# Patient Record
Sex: Male | Born: 1950 | Hispanic: Yes | Marital: Single | Smoking: Former smoker
Health system: Southern US, Community
[De-identification: ages and names within clinical notes are randomized; demographics above are authoritative.]

## PROBLEM LIST (undated history)

## (undated) DIAGNOSIS — H5789 Other specified disorders of eye and adnexa: Secondary | ICD-10-CM

## (undated) DIAGNOSIS — N183 Chronic kidney disease, stage 3 (moderate): Secondary | ICD-10-CM

## (undated) DIAGNOSIS — I219 Acute myocardial infarction, unspecified: Secondary | ICD-10-CM

## (undated) DIAGNOSIS — I2510 Atherosclerotic heart disease of native coronary artery without angina pectoris: Secondary | ICD-10-CM

## (undated) DIAGNOSIS — E119 Type 2 diabetes mellitus without complications: Secondary | ICD-10-CM

## (undated) DIAGNOSIS — Z7901 Long term (current) use of anticoagulants: Secondary | ICD-10-CM

## (undated) DIAGNOSIS — E785 Hyperlipidemia, unspecified: Secondary | ICD-10-CM

## (undated) DIAGNOSIS — B49 Unspecified mycosis: Secondary | ICD-10-CM

## (undated) DIAGNOSIS — I255 Ischemic cardiomyopathy: Secondary | ICD-10-CM

## (undated) DIAGNOSIS — E1122 Type 2 diabetes mellitus with diabetic chronic kidney disease: Secondary | ICD-10-CM

## (undated) DIAGNOSIS — I447 Left bundle-branch block, unspecified: Secondary | ICD-10-CM

## (undated) HISTORY — PX: EYE SURGERY: SHX253

## (undated) HISTORY — DX: Left bundle-branch block, unspecified: I44.7

## (undated) HISTORY — DX: Long term (current) use of anticoagulants: Z79.01

## (undated) HISTORY — DX: Hyperlipidemia, unspecified: E78.5

## (undated) HISTORY — DX: Chronic kidney disease, stage 3 (moderate): N18.3

## (undated) HISTORY — DX: Atherosclerotic heart disease of native coronary artery without angina pectoris: I25.10

## (undated) HISTORY — PX: OTHER SURGICAL HISTORY: SHX169

## (undated) HISTORY — DX: Type 2 diabetes mellitus with diabetic chronic kidney disease: E11.22

## (undated) HISTORY — DX: Unspecified mycosis: B49

## (undated) HISTORY — DX: Ischemic cardiomyopathy: I25.5

## (undated) HISTORY — DX: Acute myocardial infarction, unspecified: I21.9

## (undated) HISTORY — PX: CORONARY ARTERY BYPASS GRAFT: SHX141

## (undated) HISTORY — DX: Other specified disorders of eye and adnexa: H57.89

## (undated) HISTORY — DX: Type 2 diabetes mellitus without complications: E11.9

---

## 2005-06-22 ENCOUNTER — Emergency Department (HOSPITAL_COMMUNITY): Admission: EM | Admit: 2005-06-22 | Discharge: 2005-06-22 | Payer: Self-pay | Admitting: Family Medicine

## 2006-01-11 ENCOUNTER — Emergency Department: Payer: Self-pay | Admitting: Emergency Medicine

## 2007-01-22 ENCOUNTER — Ambulatory Visit (HOSPITAL_COMMUNITY): Admission: RE | Admit: 2007-01-22 | Discharge: 2007-01-22 | Payer: Self-pay | Admitting: Internal Medicine

## 2015-12-01 ENCOUNTER — Other Ambulatory Visit: Payer: Self-pay | Admitting: *Deleted

## 2017-03-29 DIAGNOSIS — H16001 Unspecified corneal ulcer, right eye: Secondary | ICD-10-CM | POA: Diagnosis not present

## 2017-03-29 DIAGNOSIS — E113312 Type 2 diabetes mellitus with moderate nonproliferative diabetic retinopathy with macular edema, left eye: Secondary | ICD-10-CM | POA: Diagnosis not present

## 2017-03-30 DIAGNOSIS — B488 Other specified mycoses: Secondary | ICD-10-CM | POA: Diagnosis not present

## 2017-03-30 DIAGNOSIS — H1820 Unspecified corneal edema: Secondary | ICD-10-CM | POA: Diagnosis not present

## 2017-03-30 DIAGNOSIS — H16031 Corneal ulcer with hypopyon, right eye: Secondary | ICD-10-CM | POA: Diagnosis not present

## 2017-03-30 DIAGNOSIS — E113412 Type 2 diabetes mellitus with severe nonproliferative diabetic retinopathy with macular edema, left eye: Secondary | ICD-10-CM | POA: Diagnosis not present

## 2017-03-30 DIAGNOSIS — H18891 Other specified disorders of cornea, right eye: Secondary | ICD-10-CM | POA: Diagnosis not present

## 2017-03-30 DIAGNOSIS — H11411 Vascular abnormalities of conjunctiva, right eye: Secondary | ICD-10-CM | POA: Diagnosis not present

## 2017-03-31 DIAGNOSIS — B488 Other specified mycoses: Secondary | ICD-10-CM | POA: Diagnosis not present

## 2017-03-31 DIAGNOSIS — E113412 Type 2 diabetes mellitus with severe nonproliferative diabetic retinopathy with macular edema, left eye: Secondary | ICD-10-CM | POA: Diagnosis not present

## 2017-03-31 DIAGNOSIS — H16031 Corneal ulcer with hypopyon, right eye: Secondary | ICD-10-CM | POA: Diagnosis not present

## 2017-03-31 DIAGNOSIS — H18891 Other specified disorders of cornea, right eye: Secondary | ICD-10-CM | POA: Diagnosis not present

## 2017-03-31 DIAGNOSIS — H11411 Vascular abnormalities of conjunctiva, right eye: Secondary | ICD-10-CM | POA: Diagnosis not present

## 2017-03-31 DIAGNOSIS — H16001 Unspecified corneal ulcer, right eye: Secondary | ICD-10-CM | POA: Diagnosis not present

## 2017-03-31 DIAGNOSIS — H43391 Other vitreous opacities, right eye: Secondary | ICD-10-CM | POA: Diagnosis not present

## 2017-03-31 DIAGNOSIS — H1820 Unspecified corneal edema: Secondary | ICD-10-CM | POA: Diagnosis not present

## 2017-04-01 DIAGNOSIS — H538 Other visual disturbances: Secondary | ICD-10-CM | POA: Diagnosis not present

## 2017-04-01 DIAGNOSIS — H40051 Ocular hypertension, right eye: Secondary | ICD-10-CM | POA: Diagnosis not present

## 2017-04-01 DIAGNOSIS — H16031 Corneal ulcer with hypopyon, right eye: Secondary | ICD-10-CM | POA: Diagnosis not present

## 2017-04-01 DIAGNOSIS — E113412 Type 2 diabetes mellitus with severe nonproliferative diabetic retinopathy with macular edema, left eye: Secondary | ICD-10-CM | POA: Diagnosis not present

## 2017-04-01 DIAGNOSIS — H11411 Vascular abnormalities of conjunctiva, right eye: Secondary | ICD-10-CM | POA: Diagnosis not present

## 2017-04-01 DIAGNOSIS — H18891 Other specified disorders of cornea, right eye: Secondary | ICD-10-CM | POA: Diagnosis not present

## 2017-04-03 DIAGNOSIS — Z794 Long term (current) use of insulin: Secondary | ICD-10-CM | POA: Diagnosis not present

## 2017-04-03 DIAGNOSIS — H40051 Ocular hypertension, right eye: Secondary | ICD-10-CM | POA: Diagnosis not present

## 2017-04-03 DIAGNOSIS — E119 Type 2 diabetes mellitus without complications: Secondary | ICD-10-CM | POA: Diagnosis not present

## 2017-04-03 DIAGNOSIS — H16001 Unspecified corneal ulcer, right eye: Secondary | ICD-10-CM | POA: Diagnosis not present

## 2017-04-04 DIAGNOSIS — H16061 Mycotic corneal ulcer, right eye: Secondary | ICD-10-CM | POA: Diagnosis not present

## 2017-04-04 DIAGNOSIS — H43393 Other vitreous opacities, bilateral: Secondary | ICD-10-CM | POA: Diagnosis not present

## 2017-04-04 DIAGNOSIS — H16001 Unspecified corneal ulcer, right eye: Secondary | ICD-10-CM | POA: Diagnosis not present

## 2017-04-06 DIAGNOSIS — H168 Other keratitis: Secondary | ICD-10-CM | POA: Diagnosis not present

## 2017-04-06 DIAGNOSIS — Z01818 Encounter for other preprocedural examination: Secondary | ICD-10-CM | POA: Diagnosis not present

## 2017-04-06 DIAGNOSIS — E119 Type 2 diabetes mellitus without complications: Secondary | ICD-10-CM | POA: Diagnosis not present

## 2017-04-06 DIAGNOSIS — Z794 Long term (current) use of insulin: Secondary | ICD-10-CM | POA: Diagnosis not present

## 2017-04-06 DIAGNOSIS — H16071 Perforated corneal ulcer, right eye: Secondary | ICD-10-CM | POA: Diagnosis not present

## 2017-04-07 DIAGNOSIS — H16001 Unspecified corneal ulcer, right eye: Secondary | ICD-10-CM | POA: Diagnosis not present

## 2017-04-10 DIAGNOSIS — H16001 Unspecified corneal ulcer, right eye: Secondary | ICD-10-CM | POA: Diagnosis not present

## 2017-04-11 DIAGNOSIS — R079 Chest pain, unspecified: Secondary | ICD-10-CM | POA: Diagnosis not present

## 2017-04-11 DIAGNOSIS — Z0001 Encounter for general adult medical examination with abnormal findings: Secondary | ICD-10-CM | POA: Diagnosis not present

## 2017-04-11 DIAGNOSIS — H5711 Ocular pain, right eye: Secondary | ICD-10-CM | POA: Diagnosis not present

## 2017-04-11 DIAGNOSIS — Z6829 Body mass index (BMI) 29.0-29.9, adult: Secondary | ICD-10-CM | POA: Diagnosis not present

## 2017-04-11 DIAGNOSIS — Z1211 Encounter for screening for malignant neoplasm of colon: Secondary | ICD-10-CM | POA: Diagnosis not present

## 2017-04-11 DIAGNOSIS — E1165 Type 2 diabetes mellitus with hyperglycemia: Secondary | ICD-10-CM | POA: Diagnosis not present

## 2017-04-11 DIAGNOSIS — Z1389 Encounter for screening for other disorder: Secondary | ICD-10-CM | POA: Diagnosis not present

## 2017-04-11 DIAGNOSIS — E1142 Type 2 diabetes mellitus with diabetic polyneuropathy: Secondary | ICD-10-CM | POA: Diagnosis not present

## 2017-04-11 DIAGNOSIS — Z23 Encounter for immunization: Secondary | ICD-10-CM | POA: Diagnosis not present

## 2017-04-11 DIAGNOSIS — Z794 Long term (current) use of insulin: Secondary | ICD-10-CM | POA: Diagnosis not present

## 2017-04-12 DIAGNOSIS — E1142 Type 2 diabetes mellitus with diabetic polyneuropathy: Secondary | ICD-10-CM | POA: Diagnosis not present

## 2017-04-12 DIAGNOSIS — Z1389 Encounter for screening for other disorder: Secondary | ICD-10-CM | POA: Diagnosis not present

## 2017-04-12 DIAGNOSIS — Z794 Long term (current) use of insulin: Secondary | ICD-10-CM | POA: Diagnosis not present

## 2017-04-12 DIAGNOSIS — Z0001 Encounter for general adult medical examination with abnormal findings: Secondary | ICD-10-CM | POA: Diagnosis not present

## 2017-04-12 DIAGNOSIS — E1165 Type 2 diabetes mellitus with hyperglycemia: Secondary | ICD-10-CM | POA: Diagnosis not present

## 2017-04-13 DIAGNOSIS — J029 Acute pharyngitis, unspecified: Secondary | ICD-10-CM | POA: Diagnosis not present

## 2017-04-13 DIAGNOSIS — Z1389 Encounter for screening for other disorder: Secondary | ICD-10-CM | POA: Diagnosis not present

## 2017-04-13 DIAGNOSIS — I5021 Acute systolic (congestive) heart failure: Secondary | ICD-10-CM | POA: Diagnosis not present

## 2017-04-13 DIAGNOSIS — I351 Nonrheumatic aortic (valve) insufficiency: Secondary | ICD-10-CM | POA: Diagnosis not present

## 2017-04-13 DIAGNOSIS — R1310 Dysphagia, unspecified: Secondary | ICD-10-CM | POA: Diagnosis not present

## 2017-04-13 DIAGNOSIS — I110 Hypertensive heart disease with heart failure: Secondary | ICD-10-CM | POA: Diagnosis present

## 2017-04-13 DIAGNOSIS — I214 Non-ST elevation (NSTEMI) myocardial infarction: Secondary | ICD-10-CM | POA: Diagnosis not present

## 2017-04-13 DIAGNOSIS — I429 Cardiomyopathy, unspecified: Secondary | ICD-10-CM | POA: Diagnosis not present

## 2017-04-13 DIAGNOSIS — E119 Type 2 diabetes mellitus without complications: Secondary | ICD-10-CM | POA: Diagnosis not present

## 2017-04-13 DIAGNOSIS — I2510 Atherosclerotic heart disease of native coronary artery without angina pectoris: Secondary | ICD-10-CM | POA: Diagnosis not present

## 2017-04-13 DIAGNOSIS — I639 Cerebral infarction, unspecified: Secondary | ICD-10-CM | POA: Diagnosis not present

## 2017-04-13 DIAGNOSIS — R1319 Other dysphagia: Secondary | ICD-10-CM | POA: Diagnosis not present

## 2017-04-13 DIAGNOSIS — E669 Obesity, unspecified: Secondary | ICD-10-CM | POA: Diagnosis present

## 2017-04-13 DIAGNOSIS — M7989 Other specified soft tissue disorders: Secondary | ICD-10-CM | POA: Diagnosis not present

## 2017-04-13 DIAGNOSIS — I255 Ischemic cardiomyopathy: Secondary | ICD-10-CM | POA: Diagnosis present

## 2017-04-13 DIAGNOSIS — E785 Hyperlipidemia, unspecified: Secondary | ICD-10-CM | POA: Diagnosis present

## 2017-04-13 DIAGNOSIS — Z6829 Body mass index (BMI) 29.0-29.9, adult: Secondary | ICD-10-CM | POA: Diagnosis not present

## 2017-04-13 DIAGNOSIS — J020 Streptococcal pharyngitis: Secondary | ICD-10-CM | POA: Diagnosis not present

## 2017-04-13 DIAGNOSIS — B0230 Zoster ocular disease, unspecified: Secondary | ICD-10-CM | POA: Diagnosis not present

## 2017-04-13 DIAGNOSIS — I513 Intracardiac thrombosis, not elsewhere classified: Secondary | ICD-10-CM | POA: Diagnosis present

## 2017-04-13 DIAGNOSIS — R51 Headache: Secondary | ICD-10-CM | POA: Diagnosis not present

## 2017-04-13 DIAGNOSIS — H5711 Ocular pain, right eye: Secondary | ICD-10-CM | POA: Diagnosis not present

## 2017-04-13 DIAGNOSIS — I5023 Acute on chronic systolic (congestive) heart failure: Secondary | ICD-10-CM | POA: Diagnosis present

## 2017-04-13 DIAGNOSIS — Z23 Encounter for immunization: Secondary | ICD-10-CM | POA: Diagnosis not present

## 2017-04-13 DIAGNOSIS — E1165 Type 2 diabetes mellitus with hyperglycemia: Secondary | ICD-10-CM | POA: Diagnosis not present

## 2017-04-13 DIAGNOSIS — Z794 Long term (current) use of insulin: Secondary | ICD-10-CM | POA: Diagnosis not present

## 2017-04-13 DIAGNOSIS — M25461 Effusion, right knee: Secondary | ICD-10-CM | POA: Diagnosis not present

## 2017-04-13 DIAGNOSIS — Z947 Corneal transplant status: Secondary | ICD-10-CM | POA: Diagnosis not present

## 2017-04-13 DIAGNOSIS — J189 Pneumonia, unspecified organism: Secondary | ICD-10-CM | POA: Diagnosis not present

## 2017-04-13 DIAGNOSIS — R0689 Other abnormalities of breathing: Secondary | ICD-10-CM | POA: Diagnosis not present

## 2017-04-13 DIAGNOSIS — Z0001 Encounter for general adult medical examination with abnormal findings: Secondary | ICD-10-CM | POA: Diagnosis not present

## 2017-04-13 DIAGNOSIS — Z1211 Encounter for screening for malignant neoplasm of colon: Secondary | ICD-10-CM | POA: Diagnosis not present

## 2017-04-13 DIAGNOSIS — Z79899 Other long term (current) drug therapy: Secondary | ICD-10-CM | POA: Diagnosis not present

## 2017-04-13 DIAGNOSIS — R079 Chest pain, unspecified: Secondary | ICD-10-CM | POA: Diagnosis not present

## 2017-04-13 DIAGNOSIS — R042 Hemoptysis: Secondary | ICD-10-CM | POA: Diagnosis not present

## 2017-04-13 DIAGNOSIS — J90 Pleural effusion, not elsewhere classified: Secondary | ICD-10-CM | POA: Diagnosis not present

## 2017-04-13 DIAGNOSIS — E1142 Type 2 diabetes mellitus with diabetic polyneuropathy: Secondary | ICD-10-CM | POA: Diagnosis not present

## 2017-04-13 DIAGNOSIS — I63411 Cerebral infarction due to embolism of right middle cerebral artery: Secondary | ICD-10-CM | POA: Diagnosis not present

## 2017-05-02 DIAGNOSIS — I214 Non-ST elevation (NSTEMI) myocardial infarction: Secondary | ICD-10-CM | POA: Diagnosis not present

## 2017-05-02 DIAGNOSIS — I63429 Cerebral infarction due to embolism of unspecified anterior cerebral artery: Secondary | ICD-10-CM | POA: Diagnosis not present

## 2017-05-02 DIAGNOSIS — R079 Chest pain, unspecified: Secondary | ICD-10-CM | POA: Diagnosis not present

## 2017-05-12 ENCOUNTER — Ambulatory Visit (INDEPENDENT_AMBULATORY_CARE_PROVIDER_SITE_OTHER): Payer: Medicare Other | Admitting: Physician Assistant

## 2017-05-12 ENCOUNTER — Encounter: Payer: Self-pay | Admitting: Physician Assistant

## 2017-05-12 VITALS — BP 116/76 | HR 91 | Temp 99.0°F | Resp 16 | Ht 62.4 in | Wt 170.4 lb

## 2017-05-12 DIAGNOSIS — I252 Old myocardial infarction: Secondary | ICD-10-CM | POA: Insufficient documentation

## 2017-05-12 DIAGNOSIS — H578 Other specified disorders of eye and adnexa: Secondary | ICD-10-CM

## 2017-05-12 DIAGNOSIS — Z7689 Persons encountering health services in other specified circumstances: Secondary | ICD-10-CM

## 2017-05-12 DIAGNOSIS — B49 Unspecified mycosis: Secondary | ICD-10-CM | POA: Insufficient documentation

## 2017-05-12 DIAGNOSIS — I219 Acute myocardial infarction, unspecified: Secondary | ICD-10-CM | POA: Insufficient documentation

## 2017-05-12 DIAGNOSIS — Z7901 Long term (current) use of anticoagulants: Secondary | ICD-10-CM | POA: Insufficient documentation

## 2017-05-12 DIAGNOSIS — I447 Left bundle-branch block, unspecified: Secondary | ICD-10-CM | POA: Diagnosis not present

## 2017-05-12 DIAGNOSIS — H5789 Other specified disorders of eye and adnexa: Secondary | ICD-10-CM

## 2017-05-12 DIAGNOSIS — R9431 Abnormal electrocardiogram [ECG] [EKG]: Secondary | ICD-10-CM

## 2017-05-12 DIAGNOSIS — E118 Type 2 diabetes mellitus with unspecified complications: Secondary | ICD-10-CM

## 2017-05-12 DIAGNOSIS — Z947 Corneal transplant status: Secondary | ICD-10-CM | POA: Diagnosis not present

## 2017-05-12 HISTORY — DX: Other specified disorders of eye and adnexa: H57.89

## 2017-05-12 HISTORY — DX: Long term (current) use of anticoagulants: Z79.01

## 2017-05-12 HISTORY — DX: Left bundle-branch block, unspecified: I44.7

## 2017-05-12 HISTORY — DX: Unspecified mycosis: B49

## 2017-05-12 LAB — POCT CBC
Granulocyte percent: 71.8 %G (ref 37–80)
HCT, POC: 41.4 % — AB (ref 43.5–53.7)
Hemoglobin: 13.6 g/dL — AB (ref 14.1–18.1)
Lymph, poc: 2.7 (ref 0.6–3.4)
MCH, POC: 26.6 pg — AB (ref 27–31.2)
MCHC: 32.8 g/dL (ref 31.8–35.4)
MCV: 81.1 fL (ref 80–97)
MID (cbc): 0.3 (ref 0–0.9)
MPV: 8.5 fL (ref 0–99.8)
POC Granulocyte: 7.6 — AB (ref 2–6.9)
POC LYMPH PERCENT: 25.4 %L (ref 10–50)
POC MID %: 2.8 % (ref 0–12)
Platelet Count, POC: 423 10*3/uL (ref 142–424)
RBC: 5.1 M/uL (ref 4.69–6.13)
RDW, POC: 15 %
WBC: 10.6 10*3/uL — AB (ref 4.6–10.2)

## 2017-05-12 MED ORDER — "PEN NEEDLES 5/16"" 31G X 8 MM MISC": 1.0000 | Freq: Every day | 1 refills | Status: DC

## 2017-05-12 NOTE — Progress Notes (Signed)
Peter Cole  MRN: [id number] DOB: 09-07-1951  PCP: Patient, No Pcp Per  Subjective:  Pt is a pleasant 66 year old male PMH recent MI, HTN, DM, GERD who presents to clinic to establish care. He speaks Spanish and is here today with his son. Mobile interpreter used today [phone]. He moved here a week and a half ago from [place]. Does not have medical records with him today.   Recent MI - About two weeks ago he presented to the emergency department in [place] for "shooting pain to his heart". He had an MI. He is s/p coronary bypass complicated by blood clot. At discharge he was d/c's on Aspirin, Lipitor, carvedilol, clopidogrel and warfarin. Last PT/INR check was about 1.5 weeks ago.  He is currently on blood thinners. He is wearing a zoll defibrillator - he was told he will need this for 2-3 months. Ejection fraction 15%.  He is feeling well today. Denies chest pain, neck pain, arm pain, shob, palpitations, orthopnea, dyspnea.   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 Currently taking Timolol, prednisolone, dorzolamide and ciprofloxacin. Before his surgery he could not see at all. Now he is able to see a little bit - he is able to make out his moving hand.   DM - Insulin 15 units HS. He has been on insulin x 7 months. He checks his blood sugars at home - usually are around 154. He was diagnosed with DM about 7 months ago. Has had 2 toes removed from left foot several years ago in [place]. He is unsure why, however he believes this was due to an infection.   HTN - carvedilol 12.5mg  qd. Started this 2 weeks ago. Denies chest pain, headache, vision changes.    He would like a referral to cardiologist and opthalmolologist.   Review of Systems  Constitutional: Negative for chills and diaphoresis.  Respiratory: Negative for cough, chest tightness, shortness of breath and wheezing.   Cardiovascular: Negative for chest pain, palpitations and leg swelling.    Gastrointestinal: Negative for diarrhea, nausea and vomiting.  Musculoskeletal: Negative for neck pain.  Neurological: Negative for dizziness, syncope, light-headedness and headaches.  Psychiatric/Behavioral: Negative for sleep disturbance. The patient is not nervous/anxious.     There are no active problems to display for this patient.   No current outpatient prescriptions on file prior to visit.   No current facility-administered medications on file prior to visit.     No Known Allergies   Objective:  Pulse 91   Temp 99 F (37.2 C) (Oral)   Resp 16   Ht 5' 2.4" (1.585 m)   Wt 170 lb 6.4 oz (77.3 kg)   SpO2 99%   BMI 30.77 kg/m    Physical Exam  Constitutional: He is oriented to person, place, and time and well-developed, well-nourished, and in no distress. No distress.  Eyes: Right conjunctiva is injected. Left conjunctiva is not injected. Right eye exhibits normal extraocular motion. Left eye exhibits normal extraocular motion. Right pupil is not reactive. Left pupil is reactive.  Sutures appreciated circumferentially around iris.   Neck: Normal range of motion. Neck supple. Carotid bruit is not present.  Cardiovascular: Normal rate, regular rhythm, S1 normal, S2 normal, normal heart sounds and normal pulses.   No murmur heard. 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 ).  Neurological: He is alert and oriented  to person, place, and time. He has normal motor skills and normal sensation. GCS score is 15.  Skin: Skin is warm and dry.  No lesions, ulcerations, or infections of b/l feet. Distal pulses present.   Psychiatric: Mood, memory, affect and judgment normal.  Vitals reviewed.  EKG - sr, LBBB probably chronic   Assessment and Plan :  This case was discussed with Dr. Einar Gip.    1. Encounter to establish care - Pt is a 66 year old male with multiple problems here to establish care. He moved  here a week and a half ago from [place]. Does not have medical records with him today. Will have pt fill out paperwork to fax his medical records here. Telephone communications should be done through his son, Ciro Tashiro at [phone]. Advised pt to f/u in 6 weeks.  2. History of myocardial infarction 3. Nonspecific abnormal electrocardiogram (ECG) (EKG) 4. LBBB (left bundle branch block) 5. Warfarin anticoagulation - Lipid panel - POCT CBC - EKG 12-Lead - Ambulatory referral to Cardiology - Protime-INR - Discussed this pt's recent history and current EKG with Dr. Einar Gip. Dr. Einar Gip does not appreciate acute findings and suspects LBBB to be chronic. Pt is stable and asymptomatic. He is s/p MI with recent coronary bypass. Forms filled out today to have his medical records transferred here. Labs are pending. I will refer Mr. Erhart to Dr. Einar Gip.   6. Type 2 diabetes mellitus with complication, without long-term current use of insulin (HCC) - CMP14+EGFR - Hemoglobin A1c - Ambulatory referral to Endocrinology - Insulin Pen Needle (PEN NEEDLES 31GX5/16") 31G X 8 MM MISC; 1 Dose by Does not apply route daily.  Dispense: 30 each; Refill: 1 - Labs are pending. Will refer to endocrinology to establish care and management of insulin.   7. H/O cornea transplant 8. 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 Stable. Currently taking Timolol, prednisolone, dorzolamide and ciprofloxacin. Will refer for follow-up.    Mercer Pod, PA-C  [facility] at [facility] 05/12/2017 3:11 PM

## 2017-05-12 NOTE — Patient Instructions (Addendum)
Please call Peter Cole in [place], [place] and request release of information. Please have his records sent here to Primary Care at [facility]. [phone]  You will receive phone calls to schedule your appointments with cardiology (heart), opthalmology (eye) and endocrinology (diabetes).   Please come back and see me in 6 weeks.   Thank you for coming in today. I hope you feel we met your needs.  Feel free to call [facility] if you have any questions or further requests.  Please consider signing up for MyChart if you do not already have it, as this is a great way to communicate with me.  Best,  Peter Cole, Peter Cole   Recuento de carbohidratos para la diabetes mellitus en los adultos Carbohydrate Counting for Diabetes Mellitus, Adult El recuento de carbohidratos es un mtodo destinado a Catering manager un registro de la cantidad de carbohidratos que se ingieren. La ingesta natural de carbohidratos aumenta la cantidad de azcar (glucosa) en la sangre. El recuento de la cantidad de carbohidratos que se ingieren sirve para que el nivel de glucosa sangunea (glucemia) permanezca dentro de los lmites normales, lo que ayuda a Theatre manager la diabetes (diabetes mellitus) bajo control. Es importante saber la cantidad de carbohidratos que se pueden ingerir en cada comida sin correr Engineer, manufacturing. Esto es Psychologist, forensic. Un especialista en alimentacin y nutricin (nutricionista certificado) puede ayudarlo a crear un plan de alimentacin y a calcular la cantidad de carbohidratos que debe ingerir en cada comida y colacin. Los siguientes alimentos incluyen carbohidratos:  Granos, como panes y cereales.  Frijoles secos y productos con soja.  Verduras con almidn, como papas, guisantes y maz.  Lambert Mody y jugos de frutas.  Leche y Estate agent.  Dulces y colaciones, como pasteles, galletas, caramelos, papas fritas y refrescos.  Cmo se calculan los carbohidratos? Hay dos maneras de calcular los  carbohidratos de los alimentos. Puede usar cualquiera de los dos mtodos o [place] combinacin de [place]. Leer la etiqueta de informacin nutricional de los alimentos envasados La lista de informacin nutricional est incluida en las etiquetas de casi todas las bebidas y los alimentos envasados de los [place]. Incluye lo siguiente:  El tamao de la porcin.  Informacin sobre los nutrientes de cada porcin, incluidos los gramos (g) de carbohidratos por porcin.  Para usar la informacin nutricional:  Decida cuntas porciones va a comer.  Multiplique la cantidad de porciones por el nmero de carbohidratos por porcin.  El resultado es la cantidad total de carbohidratos que comer.  Conocer los tamaos de las porciones estndar de otros alimentos Cuando coma alimentos que contienen carbohidratos que no estn envasados o no incluyen la informacin nutricional en la etiqueta, debe medir las porciones para poder calcular la cantidad de carbohidratos:  Mida los alimentos que comer con una balanza de alimentos o una taza medidora, si es necesario.  Decida cuntas porciones de International aid/development worker.  Multiplique el nmero de porciones por15. La mayora de los alimentos con alto contenido de carbohidratos contienen unos 15g de carbohidratos por porcin. ? Por ejemplo, si come 8onzas (170g) de fresas, habr comido 2porciones y 30g de carbohidratos (2porciones x 15g=30g).  En el caso de las comidas que contienen mezclas de ms de un alimento, como las sopas y los guisos, debe calcular los carbohidratos de cada alimento que se incluye.  La siguiente [employer] tamaos de las porciones estndar de los alimentos corrientes con alto contenido de carbohidratos. Cada una de estas porciones tiene aproximadamente 15g  de carbohidratos:  pan de hamburguesa o muffin ingls.  onza (61m) de almbar.  onza (14g) de gelatina.  1rebanada de pan.  1tortilla de  seispulgadas.  3onzas (85g) de arroz o pasta cocidos.  4onzas (113g) de frijoles secos cocidos.  4onzas (113g) de verduras con almidn, como guisantes, maz o papas.  4onzas (113g) de cereal caliente.  4onzas (113g) de pur de papas o de una papa grande al horno.  4onzas (113g) de frutas en lata o congeladas.  4onzas (1246m de jugo de frutas.  4a 6galletas.  6croquetas de pollo.  6onzas (170g) de cereales secos sin azcar.  6onzas (170g) de yogur descremado sin ningn agregado o de yogur endulzado con edulcorante artificial.  8onzas (24064mde lecNorth Carrollton8onzas (170g) de frutas frescas o una fruta pequea.  24onzas (680g) de palomitas de maz.  Ejemplo de recuento de carbohidratos Ejemplo de comida  3onzas (85g) de pechuga de pollo.  6onzas (170g) de arroz integral.  4onzas (113g) de maz.  8onzas (240m6me leche.  8onzas (170g) de fresas con crema batida sin azcar. Clculo de carbohidratos 1. Identifique los alimentos que contienen carbohidratos: ? Arroz. ? Maz. ? Leche. ? FresHughie Cole Calcule cuntas porciones come de cada alimento: ? 2 porciones de arroz. ? 1 porcin de maz. ? 1 porcin de leche. ? 1 porcin de fresas. 3. Multiplique cada nmero de porciones por 15g: ? 2 porciones de arroz x 15 g = 30 g. ? 1 porcin de maz x 15 g = 15 g. ? 1 porcin de leche x 15 g = 15 g. ? 1 porcin de fresas x 15 g = 15 g. 4. Sume todas las cantidades para conocer el total de gramos de carbohidratos consumidos: ? 30g + 15g + 15g + 15g = 75g de carbohidratos en total. Esta informacin no tiene como fin reemplazar el consejo del mdico. Asegrese de hacerle al mdico cualquier pregunta que tenga. Document Released: 01/16/2012 Document Revised: 10/11/2016 Document Reviewed: 04/06/2016 Elsevier Interactive Patient Education  2018 [employer] Cole   IF you received an x-ray today, you will receive an invoice from  [facility]. Please contact [facility] at [phone] questions or concerns regarding your invoice.   IF you received labwork today, you will receive an invoice from [facility] at [phone] questions or concerns regarding your invoice.   Our billing staff will not be able to assist you with questions regarding bills from these companies.  You will be contacted with the lab results as soon as they are available. The fastest way to get your results is to activate your My Chart account. Instructions are located on the last page of this paperwork. If you have not heard from us rK[place]arding the results in 2 weeks, please contact this office.

## 2017-05-13 LAB — CMP14+EGFR
ALT: 20 IU/L (ref 0–44)
AST: 15 IU/L (ref 0–40)
Albumin/Globulin Ratio: 1 — ABNORMAL LOW (ref 1.2–2.2)
Albumin: 3.8 g/dL (ref 3.6–4.8)
Alkaline Phosphatase: 94 IU/L (ref 39–117)
BUN/Creatinine Ratio: 24 (ref 10–24)
BUN: 36 mg/dL — ABNORMAL HIGH (ref 8–27)
Bilirubin Total: 0.3 mg/dL (ref 0.0–1.2)
CO2: 23 mmol/L (ref 20–29)
Calcium: 9.3 mg/dL (ref 8.6–10.2)
Chloride: 94 mmol/L — ABNORMAL LOW (ref 96–106)
Creatinine, Ser: 1.49 mg/dL — ABNORMAL HIGH (ref 0.76–1.27)
GFR calc Af Amer: 56 mL/min/{1.73_m2} — ABNORMAL LOW (ref >59)
GFR calc non Af Amer: 49 mL/min/{1.73_m2} — ABNORMAL LOW (ref >59)
Globulin, Total: 3.7 g/dL (ref 1.5–4.5)
Glucose: 110 mg/dL — ABNORMAL HIGH (ref 65–99)
Potassium: 4.8 mmol/L (ref 3.5–5.2)
Sodium: 140 mmol/L (ref 134–144)
Total Protein: 7.5 g/dL (ref 6.0–8.5)

## 2017-05-13 LAB — LIPID PANEL
Chol/HDL Ratio: 2.7 ratio (ref 0.0–5.0)
Cholesterol, Total: 96 mg/dL — ABNORMAL LOW (ref 100–199)
HDL: 36 mg/dL — ABNORMAL LOW (ref >39)
LDL Calculated: 30 mg/dL (ref 0–99)
Triglycerides: 151 mg/dL — ABNORMAL HIGH (ref 0–149)
VLDL Cholesterol Cal: 30 mg/dL (ref 5–40)

## 2017-05-13 LAB — PROTIME-INR
INR: 2.8 — ABNORMAL HIGH (ref 0.8–1.2)
Prothrombin Time: 27.3 s — ABNORMAL HIGH (ref 9.1–12.0)

## 2017-05-13 LAB — HEMOGLOBIN A1C
Est. average glucose Bld gHb Est-mCnc: 212 mg/dL
Hgb A1c MFr Bld: 9 % — ABNORMAL HIGH (ref 4.8–5.6)

## 2017-05-15 ENCOUNTER — Other Ambulatory Visit: Payer: Self-pay

## 2017-05-15 ENCOUNTER — Other Ambulatory Visit: Payer: Self-pay | Admitting: Physician Assistant

## 2017-05-15 ENCOUNTER — Telehealth: Payer: Self-pay | Admitting: Physician Assistant

## 2017-05-15 MED ORDER — INSULIN DETEMIR 100 UNIT/ML ~~LOC~~ SOLN: 15.0000 [IU] | Freq: Every day | SUBCUTANEOUS | 0 refills | Status: DC

## 2017-05-15 NOTE — Progress Notes (Signed)
PT/INR levels are high. Referral is in to visit Dr. Jacinto Halim - spoke with Dr. Jacinto Halim and they will manage levels. A1C is high - referral is in for endocrinology, plan to f/u there. He will RTC in 4-6 weeks for repeat labs. Consider nephrology referral.

## 2017-05-15 NOTE — Telephone Encounter (Signed)
Pt came in office and resolved.

## 2017-05-15 NOTE — Telephone Encounter (Signed)
PATIENT'S SON [facility]) STATES HIS FATHER SAW WHITNEY MCVEY ON Friday. HE FORGOT TO TELL HER THAT HIS FATHER IS COMPLETELY OUT OF HIS INSULIN. HE DID NOT KNOW THE NAME OF IT BUT HE SAID HE SHOWED HER ALL OF HIS MEDICATIONS. BEST PHONE [phone] (SON'S NAME IS Peter Cole AND HE IS ON HIS FATHER'S 2018 HIPAA) PHARMACY CHOICE IS [facility] ON [address]. MBC

## 2017-05-18 NOTE — Telephone Encounter (Signed)
Pt calling regarding referral to Cardiologist and optometrists. Advised pt that office will be contacting him to schedule that appointment.

## 2017-05-24 ENCOUNTER — Telehealth: Payer: Self-pay | Admitting: Physician Assistant

## 2017-05-24 NOTE — Telephone Encounter (Signed)
Pt was calling to see if he could get a refill on his heart medication because he has ran out.   Please Advise   Please call his son as well about any meds or appts [phone]

## 2017-05-25 NOTE — Telephone Encounter (Signed)
LVM. Advised to call back with medication to he would like refilled.

## 2017-05-25 NOTE — Telephone Encounter (Signed)
IC pt's son.  Discussed he needs to call the phamacy and have them send the refill to [place]. Pt nor son know which medication needs refill.   Son states the medication is on his list - I further explained that we did not prescribe any of those medications and we need the pharmacy to send [place] the request.

## 2017-05-26 DIAGNOSIS — E118 Type 2 diabetes mellitus with unspecified complications: Secondary | ICD-10-CM | POA: Diagnosis not present

## 2017-05-26 DIAGNOSIS — I255 Ischemic cardiomyopathy: Secondary | ICD-10-CM | POA: Diagnosis not present

## 2017-05-26 DIAGNOSIS — Z0189 Encounter for other specified special examinations: Secondary | ICD-10-CM | POA: Diagnosis not present

## 2017-05-26 DIAGNOSIS — E1165 Type 2 diabetes mellitus with hyperglycemia: Secondary | ICD-10-CM | POA: Diagnosis not present

## 2017-05-26 DIAGNOSIS — Z7901 Long term (current) use of anticoagulants: Secondary | ICD-10-CM | POA: Diagnosis not present

## 2017-05-31 DIAGNOSIS — Z7901 Long term (current) use of anticoagulants: Secondary | ICD-10-CM | POA: Diagnosis not present

## 2017-06-07 DIAGNOSIS — H25013 Cortical age-related cataract, bilateral: Secondary | ICD-10-CM | POA: Diagnosis not present

## 2017-06-07 DIAGNOSIS — Z7901 Long term (current) use of anticoagulants: Secondary | ICD-10-CM | POA: Diagnosis not present

## 2017-06-07 DIAGNOSIS — H40013 Open angle with borderline findings, low risk, bilateral: Secondary | ICD-10-CM | POA: Diagnosis not present

## 2017-06-07 DIAGNOSIS — H2513 Age-related nuclear cataract, bilateral: Secondary | ICD-10-CM | POA: Diagnosis not present

## 2017-06-08 ENCOUNTER — Other Ambulatory Visit: Payer: Self-pay

## 2017-06-08 ENCOUNTER — Telehealth: Payer: Self-pay | Admitting: *Deleted

## 2017-06-08 ENCOUNTER — Telehealth: Payer: Self-pay | Admitting: Physician Assistant

## 2017-06-08 ENCOUNTER — Other Ambulatory Visit: Payer: Self-pay | Admitting: Physician Assistant

## 2017-06-08 DIAGNOSIS — E119 Type 2 diabetes mellitus without complications: Secondary | ICD-10-CM

## 2017-06-08 MED ORDER — GLUCOSE BLOOD VI STRP: 1.0000 | ORAL_STRIP | 2 refills | Status: DC | PRN

## 2017-06-08 MED ORDER — GLUCOSE BLOOD VI STRP: 1.0000 | ORAL_STRIP | 1 refills | Status: DC | PRN

## 2017-06-08 NOTE — Telephone Encounter (Signed)
Pt called in because he has ran out of his strips to check his bl sugar & wants to know if he could have that sent over to his pharmacy as soon as possible.   Please Advise   [phone] call his son because he speaks english & will be picking it up for his father

## 2017-06-08 NOTE — Telephone Encounter (Signed)
Spoke with patient's son regarding medication. Per Huntley Estelle, PA, advise pt's son for his dad to continue to take all of his meds until his appt with Dr. Jacinto Halim. Appt is schedule for Mon., 06/12/17, at [facility]. Glucose test strips were sent to [facility] on [address] son made aware.

## 2017-06-08 NOTE — Telephone Encounter (Signed)
Strips sent to Pharmacy, son had been informed.

## 2017-06-09 NOTE — Telephone Encounter (Signed)
Whitney, Received denial from cover my meds regarding patients test strips,  They are requesting a generic as the True Metrix Test Strips are not covered by his plan.   Please advise. Thanks, [employer]

## 2017-06-12 ENCOUNTER — Other Ambulatory Visit: Payer: Self-pay | Admitting: Physician Assistant

## 2017-06-12 DIAGNOSIS — I255 Ischemic cardiomyopathy: Secondary | ICD-10-CM | POA: Diagnosis not present

## 2017-06-12 DIAGNOSIS — Z0189 Encounter for other specified special examinations: Secondary | ICD-10-CM | POA: Diagnosis not present

## 2017-06-12 DIAGNOSIS — E118 Type 2 diabetes mellitus with unspecified complications: Secondary | ICD-10-CM | POA: Diagnosis not present

## 2017-06-12 DIAGNOSIS — E119 Type 2 diabetes mellitus without complications: Secondary | ICD-10-CM

## 2017-06-12 DIAGNOSIS — Z7901 Long term (current) use of anticoagulants: Secondary | ICD-10-CM | POA: Diagnosis not present

## 2017-06-12 DIAGNOSIS — E1165 Type 2 diabetes mellitus with hyperglycemia: Secondary | ICD-10-CM | POA: Diagnosis not present

## 2017-06-12 MED ORDER — GLUCOSE BLOOD VI STRP: ORAL_STRIP | 12 refills | Status: DC

## 2017-06-12 NOTE — Telephone Encounter (Signed)
Strips sent to pharmacy. TY.

## 2017-06-22 DIAGNOSIS — Z7901 Long term (current) use of anticoagulants: Secondary | ICD-10-CM | POA: Diagnosis not present

## 2017-06-23 ENCOUNTER — Telehealth: Payer: Self-pay | Admitting: Physician Assistant

## 2017-06-23 ENCOUNTER — Other Ambulatory Visit: Payer: Self-pay | Admitting: Emergency Medicine

## 2017-06-23 MED ORDER — SPIRONOLACTONE 25 MG PO TABS: 25.0000 mg | ORAL_TABLET | Freq: Every day | ORAL | 0 refills | Status: DC

## 2017-06-23 NOTE — Telephone Encounter (Signed)
Pt called in asking for a refill on his heart medication Spironolactone because he is on his last 2 today. Please call his son when it is ready because he speaks english His number is [phone]  Please advise

## 2017-06-23 NOTE — Telephone Encounter (Signed)
30 day supply prescribed until seen by McVey on 06/29/17

## 2017-06-27 ENCOUNTER — Other Ambulatory Visit: Payer: Self-pay

## 2017-06-27 MED ORDER — SPIRONOLACTONE 25 MG PO TABS: 25.0000 mg | ORAL_TABLET | Freq: Every day | ORAL | 0 refills | Status: DC

## 2017-06-29 ENCOUNTER — Telehealth: Payer: Self-pay | Admitting: Physician Assistant

## 2017-06-29 ENCOUNTER — Encounter: Payer: Self-pay | Admitting: Physician Assistant

## 2017-06-29 ENCOUNTER — Ambulatory Visit (INDEPENDENT_AMBULATORY_CARE_PROVIDER_SITE_OTHER): Payer: Medicare Other | Admitting: Physician Assistant

## 2017-06-29 VITALS — BP 124/73 | HR 88 | Temp 98.0°F | Resp 16 | Ht 62.0 in | Wt 171.4 lb

## 2017-06-29 DIAGNOSIS — N183 Chronic kidney disease, stage 3 unspecified: Secondary | ICD-10-CM | POA: Insufficient documentation

## 2017-06-29 DIAGNOSIS — E119 Type 2 diabetes mellitus without complications: Secondary | ICD-10-CM | POA: Diagnosis not present

## 2017-06-29 DIAGNOSIS — I252 Old myocardial infarction: Secondary | ICD-10-CM | POA: Diagnosis not present

## 2017-06-29 DIAGNOSIS — Z7901 Long term (current) use of anticoagulants: Secondary | ICD-10-CM

## 2017-06-29 DIAGNOSIS — E1122 Type 2 diabetes mellitus with diabetic chronic kidney disease: Secondary | ICD-10-CM

## 2017-06-29 HISTORY — DX: Chronic kidney disease, stage 3 unspecified: N18.30

## 2017-06-29 HISTORY — DX: Type 2 diabetes mellitus with diabetic chronic kidney disease: E11.22

## 2017-06-29 LAB — CBC WITH DIFFERENTIAL/PLATELET
Basophils Absolute: 0.1 10*3/uL (ref 0.0–0.2)
Basos: 1 %
EOS (ABSOLUTE): 0.3 10*3/uL (ref 0.0–0.4)
Eos: 3 %
Hematocrit: 40 % (ref 37.5–51.0)
Hemoglobin: 13.1 g/dL (ref 13.0–17.7)
Immature Grans (Abs): 0 10*3/uL (ref 0.0–0.1)
Immature Granulocytes: 0 %
Lymphocytes Absolute: 2.2 10*3/uL (ref 0.7–3.1)
Lymphs: 26 %
MCH: 27.3 pg (ref 26.6–33.0)
MCHC: 32.8 g/dL (ref 31.5–35.7)
MCV: 84 fL (ref 79–97)
Monocytes Absolute: 0.6 10*3/uL (ref 0.1–0.9)
Monocytes: 7 %
Neutrophils Absolute: 5.3 10*3/uL (ref 1.4–7.0)
Neutrophils: 63 %
Platelets: 307 10*3/uL (ref 150–379)
RBC: 4.79 x10E6/uL (ref 4.14–5.80)
RDW: 15.2 % (ref 12.3–15.4)
WBC: 8.5 10*3/uL (ref 3.4–10.8)

## 2017-06-29 LAB — HEMOGLOBIN A1C
Est. average glucose Bld gHb Est-mCnc: 258 mg/dL
Hgb A1c MFr Bld: 10.6 % — ABNORMAL HIGH (ref 4.8–5.6)

## 2017-06-29 LAB — CMP14+EGFR
ALT: 70 IU/L — ABNORMAL HIGH (ref 0–44)
AST: 34 IU/L (ref 0–40)
Albumin/Globulin Ratio: 1.3 (ref 1.2–2.2)
Albumin: 4 g/dL (ref 3.6–4.8)
Alkaline Phosphatase: 105 IU/L (ref 39–117)
BUN/Creatinine Ratio: 24 (ref 10–24)
BUN: 30 mg/dL — ABNORMAL HIGH (ref 8–27)
Bilirubin Total: 0.3 mg/dL (ref 0.0–1.2)
CO2: 24 mmol/L (ref 20–29)
Calcium: 9.6 mg/dL (ref 8.6–10.2)
Chloride: 96 mmol/L (ref 96–106)
Creatinine, Ser: 1.24 mg/dL (ref 0.76–1.27)
GFR calc Af Amer: 70 mL/min/{1.73_m2} (ref >59)
GFR calc non Af Amer: 61 mL/min/{1.73_m2} (ref >59)
Globulin, Total: 3.2 g/dL (ref 1.5–4.5)
Glucose: 397 mg/dL — ABNORMAL HIGH (ref 65–99)
Potassium: 5 mmol/L (ref 3.5–5.2)
Sodium: 136 mmol/L (ref 134–144)
Total Protein: 7.2 g/dL (ref 6.0–8.5)

## 2017-06-29 MED ORDER — WARFARIN SODIUM 5 MG PO TABS: 5.0000 mg | ORAL_TABLET | Freq: Every day | ORAL | 3 refills | Status: DC

## 2017-06-29 NOTE — Progress Notes (Signed)
Peter Cole  MRN: [id number] DOB: 26-Oct-1951  PCP: Patient, No Pcp Per  Subjective:  Pt is a 66 year old male PMH ischemic cardiomyopathy, LBBB, s/p CABG, stage 3 CKD, HLD who presents to clinic for f/u lab work. He speaks Spanish - mobile interpret used today.   Last OV 7/06 - referred to cardiology and endocrinology.   DM - Levemir. He checks sugars in the morning only. Will use 20 or 15 units. He has an appt in November with endocrinology. He checks blood sugars daily: 140, 150, 160's. He endorses n/t of his feet x 1 month.  S/p cornia transplant of the right eye. He was referred to the opthamologist at this last OV.  He has a f/u appt 9/21 to assess this other eye.   Cardiomyopathy - He is followed closely by Dr. Einar Gip. Next appt is next month. He is taking ASA '81mg'$  qd, lipitor, Coumadin. Last appt with Ganji pt was started on Spironolactone '25mg'$  qd, ramipril '5mg'$  qd. He is taking Carvedilol '25mg'$ .  He has INR checked by cardiology. Last INR was 3.1. Neck INR check is Aug 30.   Review of Systems  Constitutional: Negative for chills and diaphoresis.  Respiratory: Negative for cough, chest tightness, shortness of breath and wheezing.   Cardiovascular: Negative for chest pain, palpitations and leg swelling.  Gastrointestinal: Negative for diarrhea, nausea and vomiting.  Endocrine: Negative for polydipsia, polyphagia and polyuria.  Musculoskeletal: Negative for neck pain.  Neurological: Positive for numbness. Negative for dizziness, syncope, light-headedness and headaches.  Psychiatric/Behavioral: Negative for sleep disturbance. The patient is not nervous/anxious.     Patient Active Problem List   Diagnosis Date Noted  . Diabetes mellitus without complication ([place]) 40/98/1191  . Myocardial infarction ([place]) 05/12/2017  . Fungal infection of eye 05/12/2017  . History of myocardial infarction 05/12/2017  . Warfarin anticoagulation 05/12/2017  . LBBB (left bundle branch block)  05/12/2017    Current Outpatient Prescriptions on File Prior to Visit  Medication Sig Dispense Refill  . aspirin EC 81 MG tablet Take 81 mg by mouth daily.    Marland Kitchen atorvastatin (LIPITOR) 80 MG tablet Take 80 mg by mouth daily.    . carvedilol (COREG) 12.5 MG tablet Take 12.5 mg by mouth 2 (two) times daily with a meal.    . ciprofloxacin (CILOXAN) 0.3 % ophthalmic solution Place 1 drop into the right eye 2 (two) times daily. Administer 1 drop, every 2 hours, while awake, for 2 days. Then 1 drop, every 4 hours, while awake, for the next 5 days.    . clopidogrel (PLAVIX) 75 MG tablet Take 75 mg by mouth daily.    . dorzolamide (TRUSOPT) 2 % ophthalmic solution Place 1 drop into the left eye 2 (two) times daily.    . furosemide (LASIX) 40 MG tablet Take 40 mg by mouth.    Marland Kitchen glucose blood (TRUE METRIX BLOOD GLUCOSE TEST) test strip 1 each by Other route as needed for other. Use as instructed 100 each 2  . glucose blood test strip Use as instructed 100 each 12  . insulin detemir (LEVEMIR) 100 UNIT/ML injection Inject 0.15 mLs (15 Units total) into the skin daily. 10 mL 0  . Insulin Pen Needle (PEN NEEDLES 31GX5/16") 31G X 8 MM MISC 1 Dose by Does not apply route daily. 30 each 1  . Lancet Device MISC by Does not apply route.    . pantoprazole (PROTONIX) 40 MG tablet Take 40 mg by mouth daily.    Marland Kitchen  polyvinyl alcohol (LIQUIFILM TEARS) 1.4 % ophthalmic solution Place 1 drop into both eyes as needed for dry eyes.    . potassium chloride SA (K-DUR,KLOR-CON) 20 MEQ tablet Take 20 mEq by mouth daily.    . prednisoLONE acetate (PRED FORTE) 1 % ophthalmic suspension Place 1 drop into the right eye 4 (four) times daily.    . ramipril (ALTACE) 5 MG capsule Take 5 mg by mouth daily.    Marland Kitchen spironolactone (ALDACTONE) 25 MG tablet Take 1 tablet (25 mg total) by mouth daily. 30 tablet 0  . timolol (BETIMOL) 0.5 % ophthalmic solution Place 1 drop into both eyes 2 (two) times daily.    . TRUEPLUS LANCETS 74B MISC 1  application by Does not apply route as directed.    . warfarin (COUMADIN) 7.5 MG tablet Take 7.5 mg by mouth daily at 6 PM.     No current facility-administered medications on file prior to visit.     No Known Allergies   Objective:  There were no vitals taken for this visit.  Physical Exam  Constitutional: He is oriented to person, place, and time and well-developed, well-nourished, and in no distress. No distress.  Eyes: Conjunctivae are normal. Right pupil is not reactive.  Suture marks visible on iris  Cardiovascular: Normal rate, regular rhythm and normal heart sounds.   Neurological: He is alert and oriented to person, place, and time. GCS score is 15.  Skin: Skin is warm and dry.  Psychiatric: Mood, memory, affect and judgment normal.  Vitals reviewed.   Assessment and Plan :  1. Diabetes mellitus without complication ([place]) - BUY37+QDUK - CBC with Differential/Platelet - Hemoglobin A1c - Microalbumin, urine - Pt has appt in November with endocrinology. Labs are pending, will contact with results. RTC in 3 months.  2. History of myocardial infarction - Pt is followed by Dr. Einar Gip. Next appt is next month. Con't current medications. He is taking ASA '81mg'$  qd, lipitor, Coumadin. Last appt with Ganji pt was started on Spironolactone '25mg'$  qd, ramipril '5mg'$  qd. He is taking Carvedilol '25mg'$ .   3. Warfarin anticoagulation - warfarin (COUMADIN) 5 MG tablet; Take 1 tablet (5 mg total) by mouth daily at 6 PM.  Dispense: 30 tablet; Refill: 3 - He has INR checked by cardiology. Last INR was 3.1. Neck INR check is Aug 30.   4. Stage 3 chronic kidney disease due to type 2 diabetes mellitus ([place]) - Ambulatory referral to Nephrology   Mercer Pod, PA-C  [facility] 06/29/2017 10:11 AM

## 2017-06-29 NOTE — Patient Instructions (Addendum)
  Recibir una llamada telefnica para programar una cita con el especialista en riones (You will receive a phone call to schedule an appt with the kidney specialist.) Por favor, conserve todas sus citas con el cardilogo, el oftalmlogo y Photographer (Please keep all of your appointments with the cardiologist, eye doctor and endocrinologist.) Vuelve a verme en 3 meses. (Come back and see me in 3 months.)  Thank you for coming in today. I hope you feel we met your needs.  Feel free to call PCP if you have any questions or further requests.  Please consider signing up for MyChart if you do not already have it, as this is a great way to communicate with me.  Best,  Whitney McVey, PA-C   IF you received an x-ray today, you will receive an invoice from [facility]. Please contact [facility] at [phone] with questions or concerns regarding your invoice.   IF you received labwork today, you will receive an invoice from [place]. Please contact [facility] at [phone] with questions or concerns regarding your invoice.   Our billing staff will not be able to assist you with questions regarding bills from these companies.  You will be contacted with the lab results as soon as they are available. The fastest way to get your results is to activate your My Chart account. Instructions are located on the last page of this paperwork. If you have not heard from [place] regarding the results in 2 weeks, please contact this office.

## 2017-06-29 NOTE — Telephone Encounter (Signed)
Patient called, required translator, the patient expressed that he was supposed to pick up two medications but only one was ordered (warfarin was the medicine picked up). Patient said he also needed a medicine called cardedilol, but this was not at the pharmacy. Patient wishes the perscription to be sent to the same [facility] he picked up the Warfarin from, located in [place].  Please advise  Called on [phone]

## 2017-06-30 LAB — MICROALBUMIN, URINE: Microalbumin, Urine: 614.2 ug/mL

## 2017-07-03 ENCOUNTER — Telehealth: Payer: Self-pay | Admitting: Physician Assistant

## 2017-07-03 NOTE — Telephone Encounter (Signed)
Pt was calling in to verify that the message from the 23rd was put In because he has not heard anything back yet from [place], he wants [place] to call his son as soon as the medication Cardedilol is ready for pick up at the [facility] pharmacy..[phone]  Please Advise

## 2017-07-06 DIAGNOSIS — I255 Ischemic cardiomyopathy: Secondary | ICD-10-CM | POA: Insufficient documentation

## 2017-07-06 DIAGNOSIS — I10 Essential (primary) hypertension: Secondary | ICD-10-CM | POA: Diagnosis not present

## 2017-07-06 HISTORY — DX: Ischemic cardiomyopathy: I25.5

## 2017-07-07 ENCOUNTER — Other Ambulatory Visit: Payer: Self-pay | Admitting: Physician Assistant

## 2017-07-07 DIAGNOSIS — I10 Essential (primary) hypertension: Secondary | ICD-10-CM

## 2017-07-07 DIAGNOSIS — Z7901 Long term (current) use of anticoagulants: Secondary | ICD-10-CM | POA: Diagnosis not present

## 2017-07-07 MED ORDER — CARVEDILOL 12.5 MG PO TABS
12.5000 mg | ORAL_TABLET | Freq: Two times a day (BID) | ORAL | 4 refills | Status: DC
Start: 2017-07-07 — End: 2017-08-15

## 2017-07-07 NOTE — Progress Notes (Signed)
Pt has appt with endocrinology in Nov, however will try to get him seen sooner.

## 2017-07-07 NOTE — Telephone Encounter (Signed)
My apologies. I was not under the impression he needed refills. Rx sent to pharmacy.  Thank you!

## 2017-07-07 NOTE — Addendum Note (Signed)
Addended by: Sebastian Ache on: 07/07/2017 05:02 PM   Modules accepted: Orders

## 2017-07-14 DIAGNOSIS — E1122 Type 2 diabetes mellitus with diabetic chronic kidney disease: Secondary | ICD-10-CM | POA: Diagnosis not present

## 2017-07-14 DIAGNOSIS — I10 Essential (primary) hypertension: Secondary | ICD-10-CM | POA: Diagnosis not present

## 2017-07-14 DIAGNOSIS — Z951 Presence of aortocoronary bypass graft: Secondary | ICD-10-CM | POA: Diagnosis not present

## 2017-07-14 DIAGNOSIS — I255 Ischemic cardiomyopathy: Secondary | ICD-10-CM | POA: Diagnosis not present

## 2017-07-19 ENCOUNTER — Telehealth: Payer: Self-pay | Admitting: Physician Assistant

## 2017-07-19 NOTE — Telephone Encounter (Signed)
Thank you :)

## 2017-07-19 NOTE — Telephone Encounter (Signed)
Westly Pam from [facility] called stating pt referral was not accepted by their doctor. I sent here to try to get an earlier appt. I have now sent the referral to [facility]. This office is scheduling the soonest out of the Endo doctors we normally send to. Thanks!

## 2017-07-20 ENCOUNTER — Telehealth: Payer: Self-pay | Admitting: Endocrinology

## 2017-07-20 ENCOUNTER — Telehealth: Payer: Self-pay | Admitting: Physician Assistant

## 2017-07-20 NOTE — Telephone Encounter (Signed)
Calling to check on the status of new patient referral that is urgent. Please call.

## 2017-07-20 NOTE — Telephone Encounter (Signed)
Diane from [facility] called to verify pt information for referral appt he has scheduled in November. I sent the new urgent referral to [facility] as well to see if we can get a sooner appt. I told Diane the referral had been changed to urgent as well and she said she would send it for review in their office to see if they can fit him in sooner. Both offices are working on soonest appts for the pt and are going to give me a call when they find out. We will schedule pt for the soonest between the two. Thanks!

## 2017-07-21 ENCOUNTER — Encounter: Payer: Self-pay | Admitting: *Deleted

## 2017-07-24 NOTE — Telephone Encounter (Signed)
Pt is scheduled at [facility] on 9/21 at 3:45pm. I spoke with the pt's son and he said it was okay for me to cancel the other appt with Dr. Sharl Ma. Thanks!

## 2017-07-24 NOTE — Telephone Encounter (Signed)
Perfect!  Thank you so much.

## 2017-07-24 NOTE — Telephone Encounter (Signed)
Thank you so much

## 2017-07-28 ENCOUNTER — Encounter: Payer: Self-pay | Admitting: Endocrinology

## 2017-07-28 ENCOUNTER — Ambulatory Visit (INDEPENDENT_AMBULATORY_CARE_PROVIDER_SITE_OTHER): Payer: Medicare Other | Admitting: Endocrinology

## 2017-07-28 DIAGNOSIS — H353122 Nonexudative age-related macular degeneration, left eye, intermediate dry stage: Secondary | ICD-10-CM | POA: Diagnosis not present

## 2017-07-28 DIAGNOSIS — N183 Chronic kidney disease, stage 3 (moderate): Secondary | ICD-10-CM | POA: Diagnosis not present

## 2017-07-28 DIAGNOSIS — Z794 Long term (current) use of insulin: Secondary | ICD-10-CM | POA: Diagnosis not present

## 2017-07-28 DIAGNOSIS — E113293 Type 2 diabetes mellitus with mild nonproliferative diabetic retinopathy without macular edema, bilateral: Secondary | ICD-10-CM | POA: Diagnosis not present

## 2017-07-28 DIAGNOSIS — H25813 Combined forms of age-related cataract, bilateral: Secondary | ICD-10-CM | POA: Diagnosis not present

## 2017-07-28 DIAGNOSIS — H40003 Preglaucoma, unspecified, bilateral: Secondary | ICD-10-CM | POA: Diagnosis not present

## 2017-07-28 DIAGNOSIS — Z947 Corneal transplant status: Secondary | ICD-10-CM | POA: Diagnosis not present

## 2017-07-28 DIAGNOSIS — H35032 Hypertensive retinopathy, left eye: Secondary | ICD-10-CM | POA: Diagnosis not present

## 2017-07-28 DIAGNOSIS — E1122 Type 2 diabetes mellitus with diabetic chronic kidney disease: Secondary | ICD-10-CM | POA: Diagnosis not present

## 2017-07-28 DIAGNOSIS — E113292 Type 2 diabetes mellitus with mild nonproliferative diabetic retinopathy without macular edema, left eye: Secondary | ICD-10-CM | POA: Diagnosis not present

## 2017-07-28 MED ORDER — INSULIN DETEMIR 100 UNIT/ML FLEXPEN: 30.0000 [IU] | PEN_INJECTOR | SUBCUTANEOUS | 11 refills | Status: DC

## 2017-07-28 NOTE — Patient Instructions (Signed)
good diet and exercise significantly improve the control of your diabetes.  please let me know if you wish to be referred to a dietician.  high blood sugar is very risky to your health.  you should see an eye doctor and dentist every year.  It is very important to get all recommended vaccinations.  Controlling your blood pressure and cholesterol drastically reduces the damage diabetes does to your body.  Those who smoke should quit.  Please discuss these with your doctor.  check your blood sugar twice a day.  vary the time of day when you check, between before the 3 meals, and at bedtime.  also check if you have symptoms of your blood sugar being too high or too low.  please keep a record of the readings and bring it to your next appointment here (or you can bring the meter itself).  You can write it on any piece of paper.  please call us sooner if your blood sugar goes below 70, or if you have a lot of readings over 200. Please increase the insulin to 30 units each morning. Please come back for a follow-up appointment in 6 weeks Please call or message [place] next week, to tell us how the blood sugar is doing.

## 2017-07-28 NOTE — Progress Notes (Signed)
Subjective:    Patient ID: Peter Cole, male    DOB: [birth year], 66 y.o.   MRN: [id number]  HPI pt is referred by Hulen Shouts, PA, for diabetes.  Pt states DM was dx'ed in 2015; he has moderate neuropathy of the lower extremities; he has associated renal failure and toe amputation; he has been on insulin since 2017; pt says his diet and exercise are fair; he has never had pancreatitis, pancreatic surgery, severe hypoglycemia or DKA.  He takes levemir, 15-20 units qam, and says he never misses it.  He says cbg's are in the low-100's fasting, which is the only time of day he checks.  Past Medical History:  Diagnosis Date  . Diabetes mellitus without complication (HCC)   . Myocardial infarction [facility])     Past Surgical History:  Procedure Laterality Date  . EYE SURGERY      Social History   Social History  . Marital status: Single    Spouse name: N/A  . Number of children: N/A  . Years of education: N/A   Occupational History  . Not on file.   Social History Main Topics  . Smoking status: Former Games developer  . Smokeless tobacco: Never Used     Comment: 40 years ago  . Alcohol use No  . Drug use: No  . Sexual activity: Not on file   Other Topics Concern  . Not on file   Social History Narrative  . No narrative on file    Current Outpatient Prescriptions on File Prior to Visit  Medication Sig Dispense Refill  . aspirin EC 81 MG tablet Take 81 mg by mouth daily.    . clopidogrel (PLAVIX) 75 MG tablet Take 75 mg by mouth daily.    . dorzolamide (TRUSOPT) 2 % ophthalmic solution Place 1 drop into the left eye 2 (two) times daily.    Marland Kitchen glucose blood (TRUE METRIX BLOOD GLUCOSE TEST) test strip 1 each by Other route as needed for other. Use as instructed 100 each 2  . glucose blood test strip Use as instructed 100 each 12  . insulin detemir (LEVEMIR) 100 UNIT/ML injection Inject 0.15 mLs (15 Units total) into the skin daily. 10 mL 0  . Lancet Device MISC by Does not apply  route.    . polyvinyl alcohol (LIQUIFILM TEARS) 1.4 % ophthalmic solution Place 1 drop into both eyes as needed for dry eyes.    . ramipril (ALTACE) 5 MG capsule Take 5 mg by mouth daily.    Marland Kitchen spironolactone (ALDACTONE) 25 MG tablet Take 1 tablet (25 mg total) by mouth daily. 30 tablet 0  . timolol (BETIMOL) 0.5 % ophthalmic solution Place 1 drop into both eyes 2 (two) times daily.    . TRUEPLUS LANCETS 33G MISC 1 application by Does not apply route as directed.    Marland Kitchen atorvastatin (LIPITOR) 80 MG tablet Take 80 mg by mouth daily.    . carvedilol (COREG) 12.5 MG tablet Take 1 tablet (12.5 mg total) by mouth 2 (two) times daily with a meal. (Patient not taking: Reported on 07/28/2017) 60 tablet 4  . furosemide (LASIX) 40 MG tablet Take 40 mg by mouth.    . pantoprazole (PROTONIX) 40 MG tablet Take 40 mg by mouth daily.    . potassium chloride SA (K-DUR,KLOR-CON) 20 MEQ tablet Take 20 mEq by mouth daily.     No current facility-administered medications on file prior to visit.     No Known Allergies  Family  History  Problem Relation Age of Onset  . Heart failure Mother   . Diabetes Mother   . Heart attack Father     BP 112/74   Pulse 70   Wt 173 lb 9.6 oz (78.7 kg)   SpO2 97%   BMI 31.75 kg/m   Review of Systems denies weight loss, headache, chest pain, sob, n/v, urinary frequency, muscle cramps, excessive diaphoresis, depression, cold intolerance, rhinorrhea, and easy bruising.  He has blurry vision.      Objective:   Physical Exam VS: see vs page GEN: no distress HEAD: head: no deformity eyes: no periorbital swelling, no proptosis external nose and ears are normal mouth: no lesion seen NECK: supple, thyroid is not enlarged CHEST WALL: no deformity LUNGS: clear to auscultation CV: reg rate and rhythm, no murmur ABD: abdomen is soft, nontender.  no hepatosplenomegaly.  not distended.  no hernia MUSCULOSKELETAL: muscle bulk and strength are grossly normal.  no obvious joint  swelling.  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  no ulcer on the feet.  feet are of normal color and temp.  Trace bilat leg edema PULSES: dorsalis pedis intact bilat.  no carotid bruit NEURO:  cn 2-12 grossly intact.   readily moves all 4's.  sensation is intact to touch on the feet, but decreased from normal SKIN:  Normal texture and temperature.  No rash or suspicious lesion is visible.   NODES:  None palpable at the neck PSYCH: alert, well-oriented.  Does not appear anxious nor depressed.   Lab Results  Component Value Date   HGBA1C 10.6 (H) 06/29/2017   Lab Results  Component Value Date   CREATININE 1.24 06/29/2017   BUN 30 (H) 06/29/2017   NA 136 06/29/2017   K 5.0 06/29/2017   CL 96 06/29/2017   CO2 24 06/29/2017   I personally reviewed electrocardiogram tracing (05/12/17): Indication: DM Impression: NSR.  No MI.  LBBB No comparison is available.   I have reviewed outside records, and summarized: Pt was noted to have severely elevated a1c, and referred here.  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   Patient Instructions  good diet and exercise significantly improve the control of your diabetes.  please let me know if you wish to be referred to a dietician.  high blood sugar is very risky to your health.  you should see an eye doctor and dentist every year.  It is very important to get all recommended vaccinations.  Controlling your blood pressure and cholesterol drastically reduces the damage diabetes does to your body.  Those who smoke should quit.  Please discuss these with your doctor.  check your blood sugar twice a day.  vary the time of day when you check, between before the 3 meals, and at bedtime.  also check if you have symptoms of your  blood sugar being too high or too low.  please keep a record of the readings and bring it to your next appointment here (or you can bring the meter itself).  You can write it on any piece of paper.  please call us sooner if your blood sugar goes below 70, or if you have a lot of readings over 200. Please increase the insulin to 30 units each morning.  Please come back for a follow-up appointment in 6 weeks Please call or message [place] next week, to tell us how the blood sugar is doing.

## 2017-07-30 DIAGNOSIS — Z794 Long term (current) use of insulin: Secondary | ICD-10-CM

## 2017-07-30 DIAGNOSIS — E1140 Type 2 diabetes mellitus with diabetic neuropathy, unspecified: Secondary | ICD-10-CM | POA: Insufficient documentation

## 2017-08-02 ENCOUNTER — Institutional Professional Consult (permissible substitution): Payer: Self-pay | Admitting: Cardiology

## 2017-08-03 ENCOUNTER — Telehealth: Payer: Self-pay | Admitting: Physician Assistant

## 2017-08-03 NOTE — Telephone Encounter (Signed)
Pt is calling to request a refill of his Ramipril, Sprionolactone, and lanclets.  Pt still uses the [facility] on [address].  Please advise

## 2017-08-04 ENCOUNTER — Other Ambulatory Visit: Payer: Self-pay

## 2017-08-04 ENCOUNTER — Encounter: Payer: Self-pay | Admitting: Cardiology

## 2017-08-04 DIAGNOSIS — I219 Acute myocardial infarction, unspecified: Secondary | ICD-10-CM

## 2017-08-04 MED ORDER — LANCET DEVICE MISC: 33.0000 g | Freq: Every day | 0 refills | Status: DC

## 2017-08-07 ENCOUNTER — Encounter: Payer: Self-pay | Admitting: Urgent Care

## 2017-08-07 ENCOUNTER — Ambulatory Visit (INDEPENDENT_AMBULATORY_CARE_PROVIDER_SITE_OTHER): Payer: Medicare Other | Admitting: Urgent Care

## 2017-08-07 VITALS — BP 113/71 | HR 77 | Temp 98.5°F | Resp 18 | Ht 62.0 in | Wt 173.0 lb

## 2017-08-07 DIAGNOSIS — E119 Type 2 diabetes mellitus without complications: Secondary | ICD-10-CM

## 2017-08-07 DIAGNOSIS — Z114 Encounter for screening for human immunodeficiency virus [HIV]: Secondary | ICD-10-CM

## 2017-08-07 DIAGNOSIS — I10 Essential (primary) hypertension: Secondary | ICD-10-CM

## 2017-08-07 DIAGNOSIS — I255 Ischemic cardiomyopathy: Secondary | ICD-10-CM

## 2017-08-07 DIAGNOSIS — Z23 Encounter for immunization: Secondary | ICD-10-CM

## 2017-08-07 MED ORDER — SPIRONOLACTONE 25 MG PO TABS: 25.0000 mg | ORAL_TABLET | Freq: Every day | ORAL | 1 refills | Status: DC

## 2017-08-07 MED ORDER — TRUEPLUS LANCETS 33G MISC: 1.0000 "application " | 1 refills | Status: DC

## 2017-08-07 MED ORDER — RAMIPRIL 5 MG PO CAPS: 5.0000 mg | ORAL_CAPSULE | Freq: Every day | ORAL | 1 refills | Status: DC

## 2017-08-07 NOTE — Progress Notes (Signed)
    MRN: [id number] DOB: [birth year]  Subjective:   Peter Cole is a 66 y.o. male presenting for follow up on Hypertension.   Currently managed with spironolactone, ramipril. Avoids salt in diet, is stayig very active. Denies dizziness, chronic headache, blurred vision, chest pain, shortness of breath, heart racing, palpitations, nausea, vomiting, abdominal pain, hematuria, lower leg swelling. Denies smoking cigarettes or drinking alcohol. He is also requesting refill for his lancets. Needs to have immunizations updated including influenza, Prevnar 13 and tdap.   Peter Cole has a current medication list which includes the following prescription(s): aspirin ec, atorvastatin, carvedilol, clopidogrel, dorzolamide, furosemide, glucose blood, glucose blood, insulin detemir, insulin detemir, lancet device, pantoprazole, polyvinyl alcohol, potassium chloride sa, ramipril, spironolactone, timolol, and trueplus lancets 33g. Also has No Known Allergies.  Peter Cole  has a past medical history of Diabetes mellitus without complication (HCC) and Myocardial infarction (HCC). Also  has a past surgical history that includes Eye surgery.  Objective:   Vitals: BP 113/71   Pulse 77   Temp 98.5 F (36.9 C) (Oral)   Resp 18   Ht 5\' 2"  (1.575 m)   Wt 173 lb (78.5 kg)   SpO2 99%   BMI 31.64 kg/m   BP Readings from Last 3 Encounters:  08/07/17 113/71  07/28/17 112/74  06/29/17 124/73   Physical Exam  Constitutional: He is oriented to person, place, and time. He appears well-developed and well-nourished.  HENT:  Mouth/Throat: Oropharynx is clear and moist.  Eyes: No scleral icterus.  Cardiovascular: Normal rate, regular rhythm and intact distal pulses.  Exam reveals no gallop and no friction rub.   No murmur heard. Pulmonary/Chest: No respiratory distress. He has no wheezes. He has no rales.  Musculoskeletal: He exhibits no edema.  Neurological: He is alert and oriented to person, place, and time.  Skin:  Skin is warm and dry.  Psychiatric: He has a normal mood and affect.    Assessment and Plan :   1. Diabetes mellitus without complication (HCC) - Continue f/u with endocrinology. Refilled lancets.   2. Essential hypertension - Well controlled. Refilled medications for next 6 months. Due for f/u then. Counseled patient on potential for adverse effects with medications prescribed today, patient verbalized understanding.   3. Screening for HIV without presence of risk factors - HIV antibody  4. Need for prophylactic vaccination and inoculation against influenza - Flu Vaccine QUAD 36+ mos IM  5. Need for Tdap vaccination - Tdap vaccine greater than or equal to 7yo IM  6. Need for prophylactic vaccination against Streptococcus pneumoniae (pneumococcus) - Pneumococcal conjugate vaccine 13-valent IM   At the end of his visit, patient stated that he has some difficulty swallowing at times. I advised he set up a follow up visit given that we covered HTN follow up, lab review from his last office visit and updated his immunizations. In the meantime, patient will try to address allergies and GERD as a source of his symptoms. He will set up an office visit with me.   Wallis Bamberg, PA-C [facility] at [facility] [phone] 08/07/2017  10:05 AM

## 2017-08-07 NOTE — Patient Instructions (Addendum)
Para alergia, tome Zyrtec  una ves diaramente. Para acides puede tomar Zantac  dos veces diaramente, una hora antes de comer.   Diabetes mellitus y [place] fsica (Diabetes Mellitus and Exercise) Hacer actividad fsica habitualmente es importante para el estado de salud general, en especial si tiene diabetes (diabetes mellitus). La actividad fsica no solo se reduce a Psychiatric nurse. Aporta muchos beneficios para la salud, como aumentar la fuerza muscular y la densidad sea, y reducir las grasas corporales y Development worker, community. Esto mejora el estado fsico, la flexibilidad y la resistencia, y todo ello redunda en un mejor estado de salud general. La actividad fsica tiene beneficios adicionales para los diabticos, entre ellos:  Disminuye el apetito.  Ayuda a bajar y Photographer glucemia bajo control.  Baja la presin arterial.  Ayuda a controlar las cantidades de sustancias grasas (lpidos) en la [place], como el colesterol y los triglicridos.  Mejora la respuesta del cuerpo a la insulina (optimizacin de la sensibilidad a la insulina).  Reduce la cantidad de insulina que el cuerpo necesita.  Reduce el riesgo de sufrir cardiopata coronaria de la siguiente forma: ? [employer] de colesterol y triglicridos. ? Aumenta los niveles de colesterol bueno. ? Disminuye la glucemia. QU ES EL PLAN DE ACTIVIDADES? El mdico o un educador para la diabetes certificado pueden ayudarlo a Teacher, English as a foreign language del tipo y de la frecuencia de actividad fsica (plan de actividades) adecuado para usted. Asegrese de lo siguiente:  Haga por lo menos semanales de ejercicios de intensidad moderada o vigorosa. Estos podran ser caminatas dinmicas, ciclismo o [place]. ? Haga ejercicios de elongacin y de fortalecimiento, como yoga o levantamiento de pesas, por lo menos 2veces por semana. ? Reparta la actividad en al menos 3das de la semana.  Haga algn tipo de actividad  fsica [employer]. ? No deje pasar ms de 2das seguidos sin hacer algn tipo de actividad fsica. ? No permanezca inactivo durante ms de seguidos. Tmese descansos frecuentes para caminar o estirarse.  Elija un tipo de ejercicio o de actividad que disfrute y establezca objetivos realistas.  Comience lentamente y aumente de [place] gradual la intensidad del ejercicio con el correr del [place]. QU DEBO SABER ACERCA DEL CONTROL DE LA DIABETES?  Contrlese la glucemia antes y despus de ejercitarse. ? Si la glucemia supera los /dl (16,1WRUE/A) antes de ejercitarse, hgase un control de la orina para detectar la presencia de cetonas. Si tiene [employer] orina, no se ejercite hasta que la glucemia se normalice.  Conozca los sntomas de la glucemia baja (hipoglucemia) y aprenda cmo tratarla. El riesgo de tener hipoglucemia [place] durante y despus de hacer actividad fsica. Los sntomas frecuentes de hipoglucemia pueden incluir los siguientes: ? Hambre. ? Ansiedad. ? Sudoracin y [employer] ? Confusin. ? Mareos o sensacin de desvanecimiento. ? Aumento de la frecuencia cardaca o palpitaciones. ? Visin borrosa. ? Hormigueo o adormecimiento alrededor [employer], los labios o la [place]. ? Temblores o sacudidas. ? Irritabilidad.  Tenga una colacin de carbohidratos de accin rpida disponible antes, durante y despus de ejercitarse, a fin de evitar o tratar la hipoglucemia.  Evite inyectarse insulina en las zonas del cuerpo que ejercitar. Por ejemplo, evite inyectarse insulina en: ? Los brazos, si juega al tenis. ? Las piernas, si corre.  Lleve registros de sus hbitos de actividad fsica. Esto puede ayudarlos a usted y al mdico a Retail banker de control de la diabetes segn  sea necesario. Escriba los siguientes datos: ? Los alimentos que consume antes y despus de Radio producer actividad fsica. ? Los niveles de glucosa en la sangre antes y despus de hacer  ejercicios. ? El tipo y cantidad de [place] fsica que Biomedical engineer. ? Cuando se prev que la insulina alcance su valor mximo, si [place] insulina. No haga actividad fsica en los momentos en que insulina alcanza su valor mximo.  Cuando comience un ejercicio o una actividad nuevos, trabaje con el mdico para asegurarse de que la actividad sea segura para usted y para Academic librarian la [place], los medicamentos o la ingesta de alimentos segn sea necesario.  Beba gran cantidad de agua mientras hace ejercicios para evitar la deshidratacin o los golpes de Airline pilot. Beba suficiente lquido para Photographer orina clara o de color amarillo plido. Esta informacin no tiene Theme park manager el consejo del mdico. Asegrese de hacerle al mdico cualquier pregunta que tenga. Document Released: 11/13/2007 Document Revised: 02/15/2016 Document Reviewed: 04/04/2016 Elsevier Interactive Patient Education  2018 [employer].   Hipertensin Hypertension La hipertensin, conocida comnmente como presin arterial alta, se produce cuando la sangre bombea en las arterias con mucha fuerza. Las arterias son los vasos sanguneos que transportan la sangre desde el corazn al resto del cuerpo. La hipertensin hace que el corazn haga ms esfuerzo para Insurance account manager y [employer] Holdings Corporation que las arterias se Armed forces training and education officer o Multimedia programmer. La hipertensin no tratada o no controlada puede causar infarto de miocardio, accidentes cerebrovasculares, enfermedad renal y otros problemas. Una lectura de la presin arterial consiste de un nmero ms alto sobre un nmero ms bajo. En condiciones ideales, la presin arterial debe estar por debajo de 120/80. El primer nmero ("superior") es la presin sistlica. Es la medida de la presin de las arterias cuando el corazn late. El segundo nmero ("inferior") es la presin diastlica. Es la medida de la presin en las arterias cuando el corazn se relaja. Cules son las causas? Se desconoce la causa de esta  afeccin. Qu incrementa el riesgo? Algunos factores de riesgo de hipertensin estn bajo su control. Otros no. Factores que puede [employer].  Tener diabetes mellitus tipo 2, colesterol alto, o ambos.  No hacer la cantidad suficiente de actividad fsica o ejercicio.  Tener sobrepeso.  Consumir mucha grasa, azcar, caloras o sal (sodio) en su dieta.  Beber alcohol en exceso. Factores que son difciles o imposibles de modificar  Tener enfermedad renal crnica.  Tener antecedentes familiares de presin arterial alta.  La edad. Los riesgos aumentan con la edad.  La raza. El riesgo es mayor para las Statistician.  El sexo. Antes de los 45aos, los hombres corren ms [employer]. Despus de los 65aos, las mujeres corren ms [employer].  Tener apnea obstructiva del sueo.  El estrs. Cules son los signos o los sntomas? La presin arterial extremadamente alta (crisis hipertensiva) puede provocar:  Dolor de [place].  Ansiedad.  Falta de aire.  Hemorragia nasal.  Nuseas y vmitos.  Dolor de pecho intenso.  Una crisis de movimientos que no puede controlar (convulsiones).  Cmo se diagnostica? Esta afeccin se diagnostica midiendo su presin arterial mientras se encuentra sentado, con el brazo apoyado sobre una superficie. El brazalete del tensimetro debe colocarse directamente sobre la piel de la parte superior del brazo y al nivel de su corazn. Debe medirla al [place] veces en el mismo brazo. Determinadas condiciones pueden causar una diferencia de presin arterial entre el brazo izquierdo y  el derecho. Ciertos factores pueden provocar que las lecturas de la presin arterial sean inferiores o superiores a lo normal (elevadas) por un perodo corto de tiempo:  Si su presin arterial es ms alta cuando se encuentra en el consultorio del mdico que cuando la mide en su hogar, se denomina "hipertensin de bata blanca". La  Harley[employer]Davidson de las personas que tienen esta afeccin no deben ser [employer]el[employer] Corporation.  Si su presin arterial es ms alta en el hogar que cuando se encuentra en el consultorio del mdico, se denomina "hipertensin enmascarada". La Harley-Davidson de las personas que tienen esta afeccin deben ser medicadas para Chief Operating Officer la presin arterial.  Si tiene una lecturas de presin arterial alta durante una visita o si tiene presin arterial normal con otros factores de riesgo:  Es posible que se le pida que regrese Banker para volver a Chief Operating Officer su presin arterial.  Se le puede pedir que se controle la presin arterial en su casa durante 1 semana o ms.  Si se le diagnostica hipertensin, es posible que se le realicen otros anlisis de sangre o estudios de diagnstico por imgenes para ayudar a su mdico a comprender su riesgo general de tener otras afecciones. Cmo se trata? Esta afeccin se trata haciendo cambios saludables en el estilo de vida, tales como ingerir alimentos saludables, realizar ms ejercicio y reducir el consumo de alcohol. El mdico puede recetarle medicamentos si los cambios en el estilo de vida no son suficientes para Museum/gallery curator la presin arterial y si:  Su presin arterial sistlica est por encima de 130.  Su presin arterial diastlica est por encima de 80.  La presin arterial deseada puede variar en funcin de las enfermedades, la edad y otros factores personales. Siga estas instrucciones en su casa: Comida y bebida  Siga una dieta con alto contenido de fibras y [place], y con bajo contenido de sodio, International aid/development worker agregada y Neurosurgeon. Un ejemplo de plan alimenticio es la dieta DASH (Dietary Approaches to Stop Hypertension, Mtodos alimenticios para detener la hipertensin). Para alimentarse de esta manera: ? Coma mucha fruta y verdura fresca. Trate de que la mitad del plato de cada comida sea de frutas y verduras. ? Coma cereales integrales, como pasta integral, arroz integral y pan  integral. Llene aproximadamente un cuarto del plato con cereales integrales. ? Coma y beba productos lcteos con bajo contenido de grasa, como leche descremada o yogur bajo en grasas. ? Evite la ingesta de cortes de carne grasa, carne procesada o curada, y carne de ave con piel. Llene aproximadamente un cuarto del plato con protenas magras, como pescado, pollo sin piel, frijoles, huevos y tofu. ? Evite ingerir alimentos prehechos o procesados. En general, estos tienen mayor cantidad de sodio, azcar agregada y Steffanie [place].  Reduzca su ingesta diaria de sodio. La mayora de las personas que tienen hipertensin deben comer menos de 1500 mg de sodio por [employer].  Limite el consumo de alcohol a no ms de 1 medida por da si es mujer y no est Orthoptist y a 2 medidas por da si es hombre. Una medida equivale a 12onzas de cerveza, 5onzas de vino o 1onzas de bebidas alcohlicas de alta graduacin. Estilo de vida  Trabaje con su mdico para mantener un peso saludable o Curator. Pregntele cual es su peso recomendado.  Realice al menos 30 minutos de ejercicio que haga que se acelere su corazn (ejercicio Magazine features editor) la [employer]V de la [place]. Estas actividades pueden incluir caminar, nadar o andar  en bicicleta.  Incluya ejercicios para fortalecer sus msculos (ejercicios de resistencia), como pilates o levantamiento de pesas, como parte de su rutina semanal de ejercicios. Intente realizar de este tipo de ejercicios al Kellogg a la [place].  No consuma ningn producto que contenga nicotina o tabaco, como cigarrillos y Administrator, Civil Service. Si necesita ayuda para dejar de fumar, consulte al mdico.  Contrlese la presin arterial en su casa segn las indicaciones del mdico.  Concurra a todas las visitas de control como se lo haya indicado el mdico. Esto es importante. Medicamentos  B[employer] International de venta libre y los recetados solamente como se lo haya indicado el  mdico. Siga cuidadosamente las indicaciones. Los medicamentos para la presin arterial deben tomarse segn las indicaciones.  No omita las dosis de medicamentos para la presin arterial. Si lo hace, estar en riesgo de tener problemas y puede hacer que los medicamentos sean menos eficaces.  Pregntele a su mdico a qu efectos secundarios o reacciones a los Museum/gallery curator. Comunquese con un mdico si:  Piensa que tiene una reaccin a un medicamento que est tomando.  Tiene dolores de cabeza frecuentes (recurrentes).  Siente mareos.  Tiene hinchazn en los tobillos.  Tiene problemas de visin. Solicite ayuda de inmediato si:  Siente un dolor de cabeza intenso o confusin.  Siente debilidad inusual o adormecimiento.  Siente que va a desmayarse.  Siente un dolor intenso en el pecho o el abdomen.  Vomita repetidas veces.  Tiene dificultad para respirar. Resumen  La hipertensin se produce cuando la sangre bombea en las arterias con mucha fuerza. Si esta afeccin no se controla, podra correr riesgo de tener complicaciones graves.  La presin arterial deseada puede variar en funcin de las enfermedades, la edad y otros factores personales. Para la [employer], una presin arterial normal es menor que 120/80.  La hipertensin se trata con cambios en el estilo de vida, medicamentos o una combinacin de Platinum. Los Danaher Corporation estilo de vida incluyen prdida de peso, ingerir alimentos sanos, seguir una dieta baja en sodio, hacer ms ejercicio y Glass blower/designer consumo de alcohol. Esta informacin no tiene Theme park manager el consejo del mdico. Asegrese de hacerle al mdico cualquier pregunta que tenga. Document Released: 10/24/2005 Document Revised: 10/05/2016 Document Reviewed: 10/05/2016 Elsevier Interactive Patient Education  2018 [employer].    IF you received an x-ray today, you will receive an invoice from [facility]. Please  contact [facility] at [phone] with questions or concerns regarding your invoice.   IF you received labwork today, you will receive an invoice from [facility]. Please contact [facility] at [phone] with questions or concerns regarding your invoice.   Our billing staff will not be able to assist you with questions regarding bills from these companies.  You will be contacted with the lab results as soon as they are available. The fastest way to get your results is to activate your My Chart account. Instructions are located on the last page of this paperwork. If you have not heard from [place] regarding the results in 2 weeks, please contact this office.

## 2017-08-08 LAB — LIPID PANEL
CHOL/HDL RATIO: 4.5 ratio (ref 0.0–5.0)
CHOLESTEROL TOTAL: 147 mg/dL (ref 100–199)
HDL: 33 mg/dL — ABNORMAL LOW (ref >39)
LDL Calculated: 70 mg/dL (ref 0–99)
Triglycerides: 218 mg/dL — ABNORMAL HIGH (ref 0–149)
VLDL Cholesterol Cal: 44 mg/dL — ABNORMAL HIGH (ref 5–40)

## 2017-08-08 LAB — HIV ANTIBODY (ROUTINE TESTING W REFLEX): HIV Screen 4th Generation wRfx: NONREACTIVE

## 2017-08-08 LAB — HEPATITIS C ANTIBODY: Hep C Virus Ab: <0.1 s/co ratio (ref 0.0–0.9)

## 2017-08-15 ENCOUNTER — Ambulatory Visit (INDEPENDENT_AMBULATORY_CARE_PROVIDER_SITE_OTHER): Payer: Medicare Other | Admitting: Cardiology

## 2017-08-15 ENCOUNTER — Encounter: Payer: Self-pay | Admitting: Cardiology

## 2017-08-15 VITALS — BP 128/74 | HR 74 | Ht 62.0 in | Wt 173.0 lb

## 2017-08-15 DIAGNOSIS — E785 Hyperlipidemia, unspecified: Secondary | ICD-10-CM

## 2017-08-15 DIAGNOSIS — I255 Ischemic cardiomyopathy: Secondary | ICD-10-CM

## 2017-08-15 DIAGNOSIS — I25810 Atherosclerosis of coronary artery bypass graft(s) without angina pectoris: Secondary | ICD-10-CM

## 2017-08-15 DIAGNOSIS — I10 Essential (primary) hypertension: Secondary | ICD-10-CM

## 2017-08-15 DIAGNOSIS — I447 Left bundle-branch block, unspecified: Secondary | ICD-10-CM | POA: Diagnosis not present

## 2017-08-15 NOTE — Patient Instructions (Addendum)
Medication Instructions:  Your physician recommends that you continue on your current medications as directed. Please refer to the Current Medication list given to you today.  -- If you need a refill on your cardiac medications before your next appointment, please call your pharmacy. --  Labwork: None ordered  Testing/Procedures: None ordered  Follow-Up: Your physician wants you to follow-up in:  3 months with Dr. Elberta Fortis  Thank you for choosing [facility]!!   Any Other Special Instructions Will Be Listed Below (If Applicable).  Dr. Elberta Fortis will speak with Dr. Jacinto Halim and we will call you.

## 2017-08-15 NOTE — Progress Notes (Signed)
Electrophysiology Office Note   Date:  08/15/2017   ID:  Peter Cole, DOB 12-Dec-1950, MRN [id number]  PCP:  Patient, No Pcp Per  Cardiologist:  Jacinto Halim Primary Electrophysiologist:  Verble Styron [name], MD    Chief Complaint  Patient presents with  . Advice Only    Ischemic cardiomyopathy     History of Present Illness: Peter Cole is a 66 y.o. male who is being seen today for the evaluation of CHF at the request of No ref. provider found. Presenting today for electrophysiology evaluation. He has a history of hypertension, hyperlipidemia, stage III CKG, ischemic cardiomyopathy with an EF of 15-20%, coronary artery disease status post CABG, and type 2 diabetes on insulin. He had an MI June 2018 in [place]. He was found to have a mural thrombus at that time and was placed on Coumadin. 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  Today, he denies symptoms of palpitations, chest pain, shortness of breath, orthopnea, PND, lower extremity edema, claudication, dizziness, presyncope, syncope, bleeding, or neurologic sequela. The patient is tolerating medications without difficulties. He says that he has no symptoms or heart failure. He can walk on flat ground without having to stop and can climb flights of stairs without issue. He does not have any chest pain. He can lie flat when he sleeps and does not wake up short of breath.   Past Medical History:  Diagnosis Date  . Diabetes mellitus without complication (HCC)   . Fungal infection of eye 05/12/2017  . Hyperlipidemia   . Ischemic cardiomyopathy 07/06/2017   06/16/2017. [facility].  Yates Decamp, MD.  Coronary artery bypass graft.  CKD stage 3 due to type 2 diabetes mellitus.  Hyperlipidemia, mixed.  . Ischemic cardiomyopathy   . LBBB (left bundle branch block) 05/12/2017  . Myocardial infarction (HCC)   . Stage 3 chronic kidney disease due to type 2 diabetes mellitus (HCC)  06/29/2017  . Warfarin anticoagulation 05/12/2017   Past Surgical History:  Procedure Laterality Date  . CORONARY ARTERY BYPASS GRAFT    . EYE SURGERY    . LEFT TOE AMPUTATION Left      Current Outpatient Prescriptions  Medication Sig Dispense Refill  . aspirin EC 81 MG tablet Take 81 mg by mouth daily.    . clopidogrel (PLAVIX) 75 MG tablet Take 75 mg by mouth daily.    . dorzolamide (TRUSOPT) 2 % ophthalmic solution Place 1 drop into the left eye 2 (two) times daily.    . Insulin Detemir (LEVEMIR FLEXPEN) 100 UNIT/ML Pen Inject 30 Units into the skin every morning. And pen needles 1/day 15 mL 11  . pantoprazole (PROTONIX) 40 MG tablet Take 40 mg by mouth daily.    . polyvinyl alcohol (LIQUIFILM TEARS) 1.4 % ophthalmic solution Place 1 drop into both eyes as needed for dry eyes.    . ramipril (ALTACE) 5 MG capsule Take 1 capsule (5 mg total) by mouth daily. 90 capsule 1  . spironolactone (ALDACTONE) 25 MG tablet Take 1 tablet (25 mg total) by mouth daily. 90 tablet 1  . timolol (BETIMOL) 0.5 % ophthalmic solution Place 1 drop into both eyes 2 (two) times daily.     No current facility-administered medications for this visit.     Allergies:   Patient has no known allergies.   Social History:  The patient  reports that he has quit smoking. He has never used smokeless tobacco. He reports  that he does not drink alcohol or use drugs.   Family History:  The patient's family history includes Diabetes in his mother; Heart attack in his father; Heart failure in his mother.    ROS:  Please see the history of present illness.   Otherwise, review of systems is positive for none.   All other systems are reviewed and negative.    PHYSICAL EXAM: VS:  BP 128/74   Pulse 74   Ht 5\' 2"  (1.575 m)   Wt 173 lb (78.5 kg)   BMI 31.64 kg/m  , BMI Body mass index is 31.64 kg/m. GEN: Well nourished, well developed, in no acute distress  HEENT: normal  Neck: no JVD, carotid bruits, or  masses Cardiac: RRR; no murmurs, rubs, or gallops,no edema  Respiratory:  clear to auscultation bilaterally, normal work of breathing GI: soft, nontender, nondistended, + BS MS: no deformity or atrophy  Skin: warm and dry Neuro:  Strength and sensation are intact Psych: euthymic mood, full affect  EKG:  EKG is ordered today. Personal review of the ekg ordered shows sinus rhythm, LBBB, 1 degree AV block  Recent Labs: 06/29/2017: ALT 70; BUN 30; Creatinine, Ser 1.24; Hemoglobin 13.1; Platelets 307; Potassium 5.0; Sodium 136    Lipid Panel     Component Value Date/Time   CHOL 147 08/07/2017 1039   TRIG 218 (H) 08/07/2017 1039   HDL 33 (L) 08/07/2017 1039   CHOLHDL 4.5 08/07/2017 1039   LDLCALC 70 08/07/2017 1039     Wt Readings from Last 3 Encounters:  08/15/17 173 lb (78.5 kg)  08/07/17 173 lb (78.5 kg)  07/28/17 173 lb 9.6 oz (78.7 kg)      Other studies Reviewed: Additional studies/ records that were reviewed today include: TTE 07/07/17  Review of the above records today demonstrates:  Ejection fraction 15-20%, mildly dilated left atrium   ASSESSMENT AND PLAN:  1.  Ischemic cardiomyopathy: Has left bundle branch block. I did discuss with him the possibility of ICD implant. If an ICD were implanted he would likely get CRT. We discussed risks and benefits. Risks include bleeding, infection, tamponade, pneumothorax. He appears to have class I symptoms and an EF less than 30% and thus would benefit from ICD therapy. He is not on therapy for his heart failure, has not been taking Coreg or Aldactone. We'll plan to start today and see him back in 3 months to assess medical compliance. We Waller Marcussen also tell him that he does not need a LifeVest any longer.  2. Coronary artery disease status post CABG: Currently no chest pain. Continue current management.  3. Hypertension: Well-controlled  4. Hyperlipidemia: Currently not on therapy for hyperlipidemia. Kamie Korber restart atorvastatin 80  mg.  Current medicines are reviewed at length with the patient today.   The patient does not have concerns regarding his medicines.  The following changes were made today:  none  Labs/ tests ordered today include:  Orders Placed This Encounter  Procedures  . EKG 12-Lead     Disposition:   FU with Michole Lecuyer 3 months  Signed, Eriberto Felch [name], MD  08/15/2017 2:49 PM     [facility] [address] [place] [address] [phone] (office) [phone] (fax)

## 2017-08-22 ENCOUNTER — Encounter: Payer: Self-pay | Admitting: Emergency Medicine

## 2017-08-22 ENCOUNTER — Encounter (HOSPITAL_COMMUNITY): Payer: Self-pay | Admitting: *Deleted

## 2017-08-22 ENCOUNTER — Inpatient Hospital Stay (HOSPITAL_COMMUNITY): Payer: Medicare Other

## 2017-08-22 ENCOUNTER — Other Ambulatory Visit: Payer: Self-pay

## 2017-08-22 ENCOUNTER — Ambulatory Visit (HOSPITAL_COMMUNITY)
Admission: RE | Admit: 2017-08-22 | Discharge: 2017-08-22 | Disposition: A | Discharge status: Home / Self Care | Payer: Medicare Other | Source: Ambulatory Visit | Attending: Emergency Medicine | Admitting: Emergency Medicine

## 2017-08-22 ENCOUNTER — Ambulatory Visit (INDEPENDENT_AMBULATORY_CARE_PROVIDER_SITE_OTHER): Payer: Medicare Other | Admitting: Emergency Medicine

## 2017-08-22 ENCOUNTER — Inpatient Hospital Stay (HOSPITAL_COMMUNITY)
Admission: EM | Admit: 2017-08-22 | Discharge: 2017-08-25 | DRG: 065 | Disposition: A | Discharge status: Home / Self Care | Payer: Medicare Other | Attending: Internal Medicine | Admitting: Internal Medicine

## 2017-08-22 VITALS — BP 112/58 | HR 76 | Temp 98.2°F | Resp 16 | Ht 62.25 in | Wt 171.2 lb

## 2017-08-22 DIAGNOSIS — R930 Abnormal findings on diagnostic imaging of skull and head, not elsewhere classified: Secondary | ICD-10-CM | POA: Insufficient documentation

## 2017-08-22 DIAGNOSIS — R4701 Aphasia: Secondary | ICD-10-CM | POA: Diagnosis present

## 2017-08-22 DIAGNOSIS — I63412 Cerebral infarction due to embolism of left middle cerebral artery: Secondary | ICD-10-CM | POA: Diagnosis not present

## 2017-08-22 DIAGNOSIS — E1142 Type 2 diabetes mellitus with diabetic polyneuropathy: Secondary | ICD-10-CM

## 2017-08-22 DIAGNOSIS — N183 Chronic kidney disease, stage 3 (moderate): Secondary | ICD-10-CM | POA: Diagnosis present

## 2017-08-22 DIAGNOSIS — Z8249 Family history of ischemic heart disease and other diseases of the circulatory system: Secondary | ICD-10-CM

## 2017-08-22 DIAGNOSIS — I5030 Unspecified diastolic (congestive) heart failure: Secondary | ICD-10-CM | POA: Diagnosis not present

## 2017-08-22 DIAGNOSIS — R29818 Other symptoms and signs involving the nervous system: Secondary | ICD-10-CM | POA: Insufficient documentation

## 2017-08-22 DIAGNOSIS — I110 Hypertensive heart disease with heart failure: Secondary | ICD-10-CM | POA: Diagnosis present

## 2017-08-22 DIAGNOSIS — E118 Type 2 diabetes mellitus with unspecified complications: Secondary | ICD-10-CM | POA: Diagnosis not present

## 2017-08-22 DIAGNOSIS — E782 Mixed hyperlipidemia: Secondary | ICD-10-CM | POA: Diagnosis present

## 2017-08-22 DIAGNOSIS — Z833 Family history of diabetes mellitus: Secondary | ICD-10-CM

## 2017-08-22 DIAGNOSIS — R471 Dysarthria and anarthria: Secondary | ICD-10-CM | POA: Diagnosis present

## 2017-08-22 DIAGNOSIS — Z79899 Other long term (current) drug therapy: Secondary | ICD-10-CM

## 2017-08-22 DIAGNOSIS — Z87891 Personal history of nicotine dependence: Secondary | ICD-10-CM

## 2017-08-22 DIAGNOSIS — R29704 NIHSS score 4: Secondary | ICD-10-CM | POA: Diagnosis present

## 2017-08-22 DIAGNOSIS — E1122 Type 2 diabetes mellitus with diabetic chronic kidney disease: Secondary | ICD-10-CM | POA: Diagnosis present

## 2017-08-22 DIAGNOSIS — I639 Cerebral infarction, unspecified: Secondary | ICD-10-CM | POA: Diagnosis not present

## 2017-08-22 DIAGNOSIS — Z951 Presence of aortocoronary bypass graft: Secondary | ICD-10-CM

## 2017-08-22 DIAGNOSIS — E1140 Type 2 diabetes mellitus with diabetic neuropathy, unspecified: Secondary | ICD-10-CM

## 2017-08-22 DIAGNOSIS — Z7982 Long term (current) use of aspirin: Secondary | ICD-10-CM

## 2017-08-22 DIAGNOSIS — Z7901 Long term (current) use of anticoagulants: Secondary | ICD-10-CM

## 2017-08-22 DIAGNOSIS — Z9114 Patient's other noncompliance with medication regimen: Secondary | ICD-10-CM | POA: Diagnosis not present

## 2017-08-22 DIAGNOSIS — I255 Ischemic cardiomyopathy: Secondary | ICD-10-CM

## 2017-08-22 DIAGNOSIS — Z7902 Long term (current) use of antithrombotics/antiplatelets: Secondary | ICD-10-CM | POA: Diagnosis not present

## 2017-08-22 DIAGNOSIS — I2510 Atherosclerotic heart disease of native coronary artery without angina pectoris: Secondary | ICD-10-CM | POA: Diagnosis present

## 2017-08-22 DIAGNOSIS — I252 Old myocardial infarction: Secondary | ICD-10-CM | POA: Diagnosis not present

## 2017-08-22 DIAGNOSIS — Z794 Long term (current) use of insulin: Secondary | ICD-10-CM

## 2017-08-22 DIAGNOSIS — I5042 Chronic combined systolic (congestive) and diastolic (congestive) heart failure: Secondary | ICD-10-CM | POA: Diagnosis present

## 2017-08-22 DIAGNOSIS — R220 Localized swelling, mass and lump, head: Secondary | ICD-10-CM | POA: Diagnosis not present

## 2017-08-22 LAB — COMPREHENSIVE METABOLIC PANEL
ALT: 25 U/L (ref 17–63)
AST: 35 U/L (ref 15–41)
Albumin: 4 g/dL (ref 3.5–5.0)
Alkaline Phosphatase: 73 U/L (ref 38–126)
Anion gap: 8 (ref 5–15)
BILIRUBIN TOTAL: 0.5 mg/dL (ref 0.3–1.2)
BUN: 29 mg/dL — ABNORMAL HIGH (ref 6–20)
CALCIUM: 9.4 mg/dL (ref 8.9–10.3)
CHLORIDE: 105 mmol/L (ref 101–111)
CO2: 24 mmol/L (ref 22–32)
Creatinine, Ser: 1.32 mg/dL — ABNORMAL HIGH (ref 0.61–1.24)
GFR, EST NON AFRICAN AMERICAN: 55 mL/min — AB (ref >60)
Glucose, Bld: 108 mg/dL — ABNORMAL HIGH (ref 65–99)
POTASSIUM: 4.2 mmol/L (ref 3.5–5.1)
Sodium: 137 mmol/L (ref 135–145)
Total Protein: 8.1 g/dL (ref 6.5–8.1)

## 2017-08-22 LAB — CBC
HEMATOCRIT: 37.2 % — AB (ref 39.0–52.0)
Hemoglobin: 12.8 g/dL — ABNORMAL LOW (ref 13.0–17.0)
MCH: 29.4 pg (ref 26.0–34.0)
MCHC: 34.4 g/dL (ref 30.0–36.0)
MCV: 85.3 fL (ref 78.0–100.0)
Platelets: 352 10*3/uL (ref 150–400)
RBC: 4.36 MIL/uL (ref 4.22–5.81)
RDW: 14.5 % (ref 11.5–15.5)
WBC: 6.8 10*3/uL (ref 4.0–10.5)

## 2017-08-22 LAB — URINALYSIS, ROUTINE W REFLEX MICROSCOPIC
Bacteria, UA: NONE SEEN
Bilirubin Urine: NEGATIVE
GLUCOSE, UA: NEGATIVE mg/dL
Hgb urine dipstick: NEGATIVE
Ketones, ur: NEGATIVE mg/dL
Leukocytes, UA: NEGATIVE
NITRITE: NEGATIVE
PH: 5 (ref 5.0–8.0)
Protein, ur: 100 mg/dL — AB
SPECIFIC GRAVITY, URINE: 1.016 (ref 1.005–1.030)

## 2017-08-22 LAB — ETHANOL

## 2017-08-22 LAB — DIFFERENTIAL
BASOS PCT: 1 %
Basophils Absolute: 0.1 10*3/uL (ref 0.0–0.1)
EOS ABS: 0.2 10*3/uL (ref 0.0–0.7)
EOS PCT: 3 %
Lymphocytes Relative: 36 %
Lymphs Abs: 2.4 10*3/uL (ref 0.7–4.0)
MONO ABS: 0.5 10*3/uL (ref 0.1–1.0)
MONOS PCT: 8 %
Neutro Abs: 3.5 10*3/uL (ref 1.7–7.7)
Neutrophils Relative %: 52 %

## 2017-08-22 LAB — APTT: APTT: 31 s (ref 24–36)

## 2017-08-22 LAB — RAPID URINE DRUG SCREEN, HOSP PERFORMED
AMPHETAMINES: NOT DETECTED
BARBITURATES: NOT DETECTED
BENZODIAZEPINES: NOT DETECTED
Cocaine: NOT DETECTED
Opiates: NOT DETECTED
Tetrahydrocannabinol: NOT DETECTED

## 2017-08-22 LAB — I-STAT TROPONIN, ED: TROPONIN I, POC: 0 ng/mL (ref 0.00–0.08)

## 2017-08-22 LAB — PROTIME-INR
INR: 1.12
Prothrombin Time: 14.3 seconds (ref 11.4–15.2)

## 2017-08-22 MED ORDER — DORZOLAMIDE HCL 2 % OP SOLN
1.0000 [drp] | Freq: Two times a day (BID) | OPHTHALMIC | Status: DC
  Administered 2017-08-23 – 2017-08-25 (×6): 1 [drp] via OPHTHALMIC
  Filled 2017-08-22: qty 10

## 2017-08-22 MED ORDER — TIMOLOL HEMIHYDRATE 0.5 % OP SOLN: 1.0000 [drp] | Freq: Two times a day (BID) | OPHTHALMIC | Status: DC

## 2017-08-22 MED ORDER — ASPIRIN EC 81 MG PO TBEC
81.0000 mg | DELAYED_RELEASE_TABLET | Freq: Every day | ORAL | Status: DC
  Administered 2017-08-23 – 2017-08-24 (×4): 81 mg via ORAL
  Filled 2017-08-22 (×3): qty 1

## 2017-08-22 MED ORDER — INSULIN ASPART 100 UNIT/ML ~~LOC~~ SOLN
0.0000 [IU] | Freq: Three times a day (TID) | SUBCUTANEOUS | Status: DC
  Administered 2017-08-23 – 2017-08-24 (×2): 2 [IU] via SUBCUTANEOUS
  Administered 2017-08-24: 3 [IU] via SUBCUTANEOUS

## 2017-08-22 MED ORDER — CLOPIDOGREL BISULFATE 75 MG PO TABS
75.0000 mg | ORAL_TABLET | Freq: Every day | ORAL | Status: DC
  Administered 2017-08-23 – 2017-08-24 (×3): 75 mg via ORAL
  Filled 2017-08-22 (×3): qty 1

## 2017-08-22 MED ORDER — HEPARIN (PORCINE) IN NACL 100-0.45 UNIT/ML-% IJ SOLN
750.0000 [IU]/h | INTRAMUSCULAR | Status: DC
  Administered 2017-08-22: 1000 [IU]/h via INTRAVENOUS
  Administered 2017-08-23: 1050 [IU]/h via INTRAVENOUS
  Administered 2017-08-25: 850 [IU]/h via INTRAVENOUS
  Filled 2017-08-22 (×3): qty 250

## 2017-08-22 MED ORDER — RAMIPRIL 5 MG PO CAPS
5.0000 mg | ORAL_CAPSULE | Freq: Every day | ORAL | Status: DC
  Administered 2017-08-23 – 2017-08-24 (×2): 5 mg via ORAL
  Filled 2017-08-22 (×2): qty 1

## 2017-08-22 MED ORDER — ACETAMINOPHEN 650 MG RE SUPP: 650.0000 mg | RECTAL | Status: DC | PRN

## 2017-08-22 MED ORDER — ENOXAPARIN SODIUM 40 MG/0.4ML ~~LOC~~ SOLN: 40.0000 mg | SUBCUTANEOUS | Status: DC

## 2017-08-22 MED ORDER — TIMOLOL MALEATE 0.5 % OP SOLN
1.0000 [drp] | Freq: Two times a day (BID) | OPHTHALMIC | Status: DC
  Administered 2017-08-23 – 2017-08-25 (×6): 1 [drp] via OPHTHALMIC
  Filled 2017-08-22: qty 5

## 2017-08-22 MED ORDER — ACETAMINOPHEN 160 MG/5ML PO SOLN: 650.0000 mg | ORAL | Status: DC | PRN

## 2017-08-22 MED ORDER — INSULIN DETEMIR 100 UNIT/ML ~~LOC~~ SOLN
15.0000 [IU] | Freq: Every day | SUBCUTANEOUS | Status: DC
  Administered 2017-08-23 – 2017-08-25 (×3): 15 [IU] via SUBCUTANEOUS
  Filled 2017-08-22 (×3): qty 0.15

## 2017-08-22 MED ORDER — ACETAMINOPHEN 325 MG PO TABS: 650.0000 mg | ORAL_TABLET | ORAL | Status: DC | PRN

## 2017-08-22 MED ORDER — PREDNISOLONE ACETATE 1 % OP SUSP
1.0000 [drp] | Freq: Three times a day (TID) | OPHTHALMIC | Status: DC
Start: 2017-08-22 — End: 2017-08-25
  Administered 2017-08-23 – 2017-08-25 (×8): 1 [drp] via OPHTHALMIC
  Filled 2017-08-22: qty 1

## 2017-08-22 MED ORDER — PANTOPRAZOLE SODIUM 40 MG PO TBEC
40.0000 mg | DELAYED_RELEASE_TABLET | Freq: Every day | ORAL | Status: DC
  Administered 2017-08-23 – 2017-08-24 (×2): 40 mg via ORAL
  Filled 2017-08-22 (×2): qty 1

## 2017-08-22 MED ORDER — STROKE: EARLY STAGES OF RECOVERY BOOK
Freq: Once | Status: AC
  Administered 2017-08-23
  Filled 2017-08-22: qty 1

## 2017-08-22 MED ORDER — POLYVINYL ALCOHOL 1.4 % OP SOLN
1.0000 [drp] | OPHTHALMIC | Status: DC | PRN
  Filled 2017-08-22: qty 15

## 2017-08-22 MED ORDER — SPIRONOLACTONE 25 MG PO TABS
25.0000 mg | ORAL_TABLET | Freq: Every day | ORAL | Status: DC
  Administered 2017-08-23 – 2017-08-24 (×2): 25 mg via ORAL
  Filled 2017-08-22 (×2): qty 1

## 2017-08-22 NOTE — H&P (Addendum)
History and Physical    Peter Cole WUJ:[id number] DOB: November 11, 1950 DOA: 08/22/2017  PCP: Patient, No Pcp Per  Patient coming from: Home  I have personally briefly reviewed patient's old medical records in [facility]  Chief Complaint: Dysarthria  HPI: Peter Cole is a 66 y.o. male with medical history significant of CAD s/p CABG.  MI in June 2018 in [place].  Echo at that time showed EF 15-20% and a left mural thrombus.  As a result he was started on Coumadin.  He also had heart cath at that time and is on ASA / Plavix as well as Coumadin (not entirely sure that stent was placed but son thinks so.  Per Son: patient was reportedly told to stop taking coumadin ~1 month ago and has just been on ASA / plavix since then.  Patient presents to the ED after being sent in by PCP today, with c/o 1-2 week history of difficulty with word finding and speech.  CT scan performed at [facility] today demonstrated findings concerning for acute / subacute strokes.  Speaks spanish, son acts as Nurse, learning disability.   ED Course: CT demonstrating acute vs subacute strokes.   Review of Systems: As per HPI otherwise 10 point review of systems negative.   Past Medical History:  Diagnosis Date  . Diabetes mellitus without complication (HCC)   . Fungal infection of eye 05/12/2017  . Hyperlipidemia   . Ischemic cardiomyopathy 07/06/2017   06/16/2017. [facility].  Yates Decamp, MD.  Coronary artery bypass graft.  CKD stage 3 due to type 2 diabetes mellitus.  Hyperlipidemia, mixed.  . Ischemic cardiomyopathy   . LBBB (left bundle branch block) 05/12/2017  . Myocardial infarction (HCC)   . 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 Warfarin anticoagulation 05/12/2017    Past Surgical History:  Procedure Laterality Date  . CORONARY ARTERY BYPASS GRAFT    . EYE SURGERY    . LEFT TOE AMPUTATION Left      reports that he has quit smoking. He has never used  smokeless tobacco. He reports that he does not drink alcohol or use drugs.  No Known Allergies  Family History  Problem Relation Age of Onset  . Heart failure Mother   . Diabetes Mother   . Heart attack Father      Prior to Admission medications   Medication Sig Start Date End Date Taking? Authorizing Provider  aspirin EC 81 MG tablet Take 81 mg by mouth daily.   Yes [provider]  clopidogrel (PLAVIX) 75 MG tablet Take 75 mg by mouth daily.   Yes [provider]  dorzolamide (TRUSOPT) 2 % ophthalmic solution Place 1 drop into the left eye 2 (two) times daily.   Yes [provider]  Insulin Detemir (LEVEMIR FLEXPEN) 100 UNIT/ML Pen Inject 30 Units into the skin every morning. And pen needles 1/day 07/28/17  Yes Romero Belling, MD  pantoprazole (PROTONIX) 40 MG tablet Take 40 mg by mouth daily.   Yes [provider]  prednisoLONE acetate (PRED FORTE) 1 % ophthalmic suspension Place 1 drop into the left eye 3 (three) times daily.   Yes [provider]  ramipril (ALTACE) 5 MG capsule Take 1 capsule (5 mg total) by mouth daily. 08/07/17  Yes Wallis Bamberg, PA-C  spironolactone (ALDACTONE) 25 MG tablet Take 1 tablet (25 mg total) by mouth daily. 08/07/17  Yes Wallis Bamberg, PA-C  timolol (BETIMOL) 0.5 % ophthalmic solution Place  1 drop into both eyes 2 (two) times daily.   Yes [provider]  polyvinyl alcohol (LIQUIFILM TEARS) 1.4 % ophthalmic solution Place 1 drop into both eyes as needed for dry eyes.    [provider]    Physical Exam: Vitals:   08/22/17 1715 08/22/17 1730 08/22/17 1745 08/22/17 1800  BP: (!) 145/104 (!) 145/132 (!) 155/77   Pulse: 67 67 71   Resp: (!) 23 13 15    Temp:    98.7 F (37.1 C)  TempSrc:      SpO2: 100% 100% 99%     Constitutional: NAD, calm, comfortable Eyes: PERRL, lids and conjunctivae normal ENMT: Mucous membranes are moist. Posterior pharynx clear of any exudate or lesions.Normal  dentition.  Neck: normal, supple, no masses, no thyromegaly Respiratory: clear to auscultation bilaterally, no wheezing, no crackles. Normal respiratory effort. No accessory muscle use.  Cardiovascular: Regular rate and rhythm, no murmurs / rubs / gallops. No extremity edema. 2+ pedal pulses. No carotid bruits.  Abdomen: no tenderness, no masses palpated. No hepatosplenomegaly. Bowel sounds positive.  Musculoskeletal: no clubbing / cyanosis. No joint deformity upper and lower extremities. Good ROM, no contractures. Normal muscle tone.  Skin: no rashes, lesions, ulcers. No induration Neurologic: Speech dysarthric Psychiatric: Normal judgment and insight. Alert and oriented x 3. Normal mood.    Labs on Admission: I have personally reviewed following labs and imaging studies  CBC:  Recent Labs Lab 08/22/17 1757  WBC 6.8  NEUTROABS 3.5  HGB 12.8*  HCT 37.2*  MCV 85.3  PLT 352   Basic Metabolic Panel:  Recent Labs Lab 08/22/17 1757  NA 137  K 4.2  CL 105  CO2 24  GLUCOSE 108*  BUN 29*  CREATININE 1.32*  CALCIUM 9.4   GFR: Estimated Creatinine Clearance: 50.7 mL/min (A) (by C-G formula based on SCr of 1.32 mg/dL (H)). Liver Function Tests:  Recent Labs Lab 08/22/17 1757  AST 35  ALT 25  ALKPHOS 73  BILITOT 0.5  PROT 8.1  ALBUMIN 4.0   No results for input(s): LIPASE, AMYLASE in the last 168 hours. No results for input(s): AMMONIA in the last 168 hours. Coagulation Profile:  Recent Labs Lab 08/22/17 1757  INR 1.12   Cardiac Enzymes: No results for input(s): CKTOTAL, CKMB, CKMBINDEX, TROPONINI in the last 168 hours. BNP (last 3 results) No results for input(s): PROBNP in the last 8760 hours. HbA1C: No results for input(s): HGBA1C in the last 72 hours. CBG: No results for input(s): GLUCAP in the last 168 hours. Lipid Profile: No results for input(s): CHOL, HDL, LDLCALC, TRIG, CHOLHDL, LDLDIRECT in the last 72 hours. Thyroid Function Tests: No results  for input(s): TSH, T4TOTAL, FREET4, T3FREE, THYROIDAB in the last 72 hours. Anemia Panel: No results for input(s): VITAMINB12, FOLATE, FERRITIN, TIBC, IRON, RETICCTPCT in the last 72 hours. Urine analysis:    Component Value Date/Time   COLORURINE YELLOW 08/22/2017 1835   APPEARANCEUR HAZY (A) 08/22/2017 1835   LABSPEC 1.016 08/22/2017 1835   PHURINE 5.0 08/22/2017 1835   GLUCOSEU NEGATIVE 08/22/2017 1835   HGBUR NEGATIVE 08/22/2017 1835   BILIRUBINUR NEGATIVE 08/22/2017 1835   KETONESUR NEGATIVE 08/22/2017 1835   PROTEINUR 100 (A) 08/22/2017 1835   NITRITE NEGATIVE 08/22/2017 1835   LEUKOCYTESUR NEGATIVE 08/22/2017 1835    Radiological Exams on Admission: Ct Head Wo Contrast  Result Date: 08/22/2017 CLINICAL DATA:  Intermittent garbled speech, tingling in the legs, diabetes with neuropathy EXAM: CT HEAD WITHOUT CONTRAST TECHNIQUE: Contiguous  axial images were obtained from the base of the skull through the vertex without intravenous contrast. COMPARISON:  CT brain of 01/22/2007 FINDINGS: Brain: There are new relatively well defined peripheral low-attenuation areas in the posterior left parietal infarct posterior right parietal lobes. No hemorrhage is seen in these areas are most consistent with edema secondary to nonhemorrhagic infarct. No mass effect is noted. The ventricular system is normal in size and configuration and the septum is midline in position. The fourth ventricle and basilar cisterns are unremarkable. Vascular: No vascular abnormality is seen on this unenhanced study. Skull: On bone window images, no calvarial abnormality is noted. Sinuses/Orbits: The paranasal sinuses appear well pneumatized. Other: None. IMPRESSION: New peripheral low-attenuation areas within the posterior left parietal and posterior right parietal lobes consistent with nonhemorrhagic infarcts, acute or subacute. MRI may be helpful for further assessment. Electronically Signed   By: Dwyane Dee M.D.   On:  08/22/2017 13:47    EKG: Independently reviewed.  Assessment/Plan Principal Problem:   Acute embolic stroke [facility]) Active Problems:   History of myocardial infarction   Warfarin anticoagulation   Ischemic cardiomyopathy   Diabetic polyneuropathy associated with type 2 diabetes mellitus (HCC)   Acute ischemic stroke (HCC)    1. Subacute ischemic strokes - concern for cardio embolism given bilateral findings on CT, HPI 1. Stroke pathway 2. MRI pending 3. Neuro consult 4. Continue ASA / plavix for h/o MI in June 5. Will put patient on heparin gtt given the subacute presentation, strong suspicion for cardioembolic event.  Likely will need to bridge back to coumadin ultimately 2. Ischemic cardiomyopathy - 1. Continue home meds 2. 2d echo for stroke work up 3. Ultimately needs AICD / pacer (see the 10/9 ECP consult note). 3. DM2 - 1. Half home levemir (15 units QAM) 2. Mod scale SSI AC  DVT prophylaxis: heparin gtt Code Status: Full Family Communication: Family at bedside Disposition Plan: Home after admit Consults called: Neuro Admission status: Admit to inpatient   Hillary Bow DO [facility] Pager [phone]  If 7AM-7PM, please contact day team taking care of patient [email or web address] Password [email or web address]  08/22/2017, 8:21 PM

## 2017-08-22 NOTE — ED Notes (Signed)
Pt taken to MRI  

## 2017-08-22 NOTE — Patient Instructions (Addendum)
Go to [facility] to Main Entrance to Radiology, Now.   IF you received an x-ray today, you will receive an invoice from [facility] Radiology. Please contact [facility]iology at [phone] with questions or concerns regarding your invoice.   IF you received labwork today, you will receive an invoice from [facility]. Please contact [facility] at [phone] with questions or concerns regarding your invoice.   Our billing staff will not be able to assist you with questions regarding bills from these companies.  You will be contacted with the lab results as soon as they are available. The fastest way to get your results is to activate your My Chart account. Instructions are located on the last page of this paperwork. If you have not heard from [place] regarding the results in 2 weeks, please contact this office.     Neuropata perifrica (Peripheral Neuropathy) La neuropata perifrica es un tipo de lesin nerviosa. Afecta a los nervios que transportan seales entre la mdula espinal y otras partes del cuerpo. Estos se denominan nervios perifricos. Con la neuropata perifrica, es posible que haya lesiones en un nervio o en un grupo de nervios. CAUSAS Las causas de las lesiones en los nervios perifricos pueden ser [place]. En algunas personas se desconoce la causa de la neuropata perifrica. Algunas causas son:  Diabetes. Esta es la causa ms comn de la neuropata perifrica.  Lesin en un nervio.  Presin o tensin duraderas en un nervio.  Haze Justin de vitamina B, cuya causa puede ser el alcoholismo.  Infecciones.  Enfermedades autoinmunitarias, como la esclerosis mltiple y el lupus eritematoso sistmico.  Enfermedades nerviosas hereditarias.  Algunos medicamentos, como los medicamentos para Management consultant.  Sustancias txicas, como el plomo y [place].  Flujo deficiente de [employer].  Enfermedades renales.  Enfermedad tiroidea. SIGNOS Y SNTOMAS Los sntomas  varan segn las personas. Los sntomas que tenga dependern del tipo de nervio que se haya lesionado. Los sntomas ms comunes son:  Prdida de la sensibilidad (adormecimiento) en los pies y [place].  Hormigueo en los pies y las manos.  Dolor y ardor.  Piel muy sensible.  Debilidad.  Imposibilidad para mover partes del cuerpo (parlisis).  Fasciculaciones (temblores musculares).  Torpeza o coordinacin deficiente.  Prdida del equilibrio.  Dificultad para controlar la vejiga.  Sensacin de [employer].  Problemas sexuales. DIAGNSTICO La neuropata perifrica es un sntoma, no una enfermedad. Puede ser difcil encontrar la causa de la neuropata perifrica. Para averiguarlo, el mdico le har una historia clnica y un examen fsico. Tambin se realizar un examen neurolgico. Esto involucra controlar aspectos que puedan verse afectados por el cerebro, la mdula espinal y los nervios (sistema nervioso). Por ejemplo, el mdico controlar sus reflejos, cmo se mueve y su grado de sensibilidad. Posiblemente le realicen otros tipos de pruebas, como las siguientes:  Anlisis de [place].  Una prueba del lquido de la mdula espinal.  Pruebas de diagnstico por imagen, como tomografas computarizadas (TC) o resonancias magnticas (RM).  Electromiograma (EMG). Esta prueba evala los nervios que controlan los msculos.  Pruebas de velocidad de conduccin nerviosa. Estas pruebas evalan la velocidad con la que los mensajes pasan a travs de los nervios.  Biopsia del nervio. Se extirpa un pedazo pequeo del nervio, que luego se examina con el microscopio. TRATAMIENTO  Con frecuencia, la neuropata perifrica se trata con medicamentos, entre los que se [employer] siguientes: ? Analgsicos. Se pueden sugerir medicamentos recetados o de [employer]. ? Medicamentos anticonvulsivos. Estos posiblemente se usen  para Chief Technology Officer. ? Antidepresivos. Estos tambin pueden ayudar a Engineer, materials  provocado por la neuropata. ? Lidocana. Este es un anestsico. Puede usar un parche o recibir una dosis. ? Mexiletina. Este medicamento se [place] normalmente para ayudar a Chief Operating Officer los ritmos cardacos irregulares.  Ciruga. Es posible que la ciruga sea necesaria para Paramedic la presin en un nervio o para destruir un nervio que est provocando dolor.  La fisioterapia ayuda al movimiento.  Dispositivos de asistencia que ayuden al [employer].  INSTRUCCIONES PARA EL CUIDADO EN EL HOGAR  Tome solo medicamentos de venta libre o recetados, segn las indicaciones del mdico. Siga cuidadosamente las instrucciones de Probation officer. No tome ningn otro medicamento sin obtener primero la aprobacin del mdico.  Si tiene diabetes, trabaje estrechamente con el mdico para mantener bajo control el nivel de Production assistant, radio.  Si siente adormecimiento en los pies, haga lo siguiente: ? Realice controles todos los das para detectar signos de lesin o infeccin. Observe seales de enrojecimiento, calor e hinchazn. ? Use calcetines acolchados y zapatos cmodos. Estos ayudan a [employer].  No realice actividades que ejerzan presin sobre el nervio lesionado.  No fume. Fumar evita que la sangre llegue a los nervios lesionados.  Evite o limite el consumo alcohol. Demasiado alcohol puede provocar una falta de vitaminas B. Estas vitaminas son necesarias para tener nervios saludables.  Desarrolle un buen sistema de apoyo. Hacerle frente a la neuropata perifrica puede ser estresante. Hable con un profesional de la salud mental o nase a un grupo de apoyo si le cuesta enfrentar la situacin.  Concurra a las consultas de control con su mdico segn las indicaciones.  SOLICITE ATENCIN MDICA SI:  Advierte signos o sntomas nuevos de la neuropata perifrica.  Le est costando lidiar con la neuropata perifrica desde el punto de vista emocional.  Tiene fiebre.  SOLICITE ATENCIN MDICA  DE INMEDIATO SI:  Tiene una lesin o infeccin que no se cura.  Se siente muy mareado o comienza a vomitar.  Siente dolor en el pecho.  Tiene dificultad para respirar.  Esta informacin no tiene Theme park manager el consejo del mdico. Asegrese de hacerle al mdico cualquier pregunta que tenga. Document Released: 02/09/2009 Document Revised: 02/15/2016 Document Reviewed: 07/01/2013 Elsevier Interactive Patient Education  2017 Elsevier Inc.  Afasia (Aphasia) La afasia es un dao en la regin del cerebro que interviene en la comunicacin. En la [employer], esa regin se encuentra en el lado izquierdo del cerebro. La afasia no afecta la inteligencia, pero tal vez le cause dificultades para hablar, comprender el lenguaje, leer o escribir. Este trastorno puede ocurrir en cualquier persona de Comptroller edad, pero es ms frecuente en aquellas de edad avanzada. CAUSAS Una interrupcin en la irrigacin sangunea al cerebro (ictus) es la causa de afasia ms comn. Cualquier enfermedad o trastorno que dae las regiones del cerebro que intervienen en la comunicacin pueden causar afasia. Estos incluyen los siguientes:  Tumores cerebrales.  Lesiones cerebrales.  Infecciones cerebrales.  Enfermedades progresivas del sistema nervioso (trastornos neurolgicos). FACTORES DE RIESGO Puede correr riesgo de tener afasia si sufri un traumatismo, una enfermedad o un trastorno que daaron las regiones del cerebro que intervienen en la comunicacin. SIGNOS Y SNTOMAS La afasia puede comenzar repentinamente si se debe a un ictus o a una lesin cerebral. La afasia causada por un tumor o un trastorno neurolgico progresivo puede comenzar de manera gradual. La alteracin afecta a las personas de [employer]. Los signos  y los sntomas de afasia incluyen:  Dificultad para encontrar la Geologist, engineering.  Usar las palabras incorrectas.  Expresarse con oraciones que no tienen  sentido.  Inventar palabras.  Incapacidad de comprender el lenguaje de [employer].  Problemas para escribir, Scientist, research (physical sciences).  Problemas con los nmeros.  Dificultad para tragar. DIAGNSTICO El mdico puede sospechar la presencia de afasia si usted pierde la capacidad de hablar o entender el lenguaje. Tal vez deba consultar a Music therapist (patlogo del habla y del lenguaje) que ayude a Chief Strategy Officer el diagnstico de [place]. Esta persona realiza una serie de pruebas para revisar su capacidad de hacer lo siguiente:  Hablar.  Expresar ideas.  Entablar una conversacin.  Entender el lenguaje.  Leer y Programmer, applications. TRATAMIENTO En algunos casos, la afasia puede mejorar por s sola con el transcurso del [place]. Generalmente, el tratamiento incluye una terapia con un especialista. El tratamiento se disear para satisfacer sus necesidades y habilidades. Los tratamientos frecuentes son los siguientes:  Terapia del habla.  Aprender otras formas de comunicarse.  Trabajar con los familiares para Clinical research associate las formas ms adecuadas para comunicarse.  Trabajar con un terapeuta ocupacional para encontrar formas para comunicarse en el trabajo. INSTRUCCIONES PARA EL CUIDADO EN EL HOGAR  Concurra a todas las visitas de control.  Asegrese de tener un buen sistema de apoyo en su hogar.  Las siguientes tcnicas pueden ser tiles al momento de comunicarse: ? Use oraciones cortas y sencillas. Pdales a los miembros de su familia que hagan lo mismo. Las oraciones que requieren respuestas de una sola palabra son ms fciles. ? Evite las distracciones, como el ruido ambiental, cuando intente Radio producer. ? Trate de [employer], seas o dibujos. ? Hable lentamente. Pdales a sus familiares que le hablen lentamente. ? Mantenga el contacto visual cuando se comunique. SOLICITE ATENCIN MDICA SI:  Los sntomas [place] o [place].  Necesita ms apoyo en su hogar.  Est  lidiando con sntomas de ansiedad o depresin.  Tiene dificultad para tragar. Esta informacin no tiene Theme park manager el consejo del mdico. Asegrese de hacerle al mdico cualquier pregunta que tenga. Document Released: 02/18/2013 Document Revised: 11/14/2014 Document Reviewed: 01/13/2014 Elsevier Interactive Patient Education  2018 [employer].

## 2017-08-22 NOTE — Progress Notes (Signed)
ANTICOAGULATION CONSULT NOTE - Initial Consult  Pharmacy Consult for heparin dosing Indication: subacute stroke and known ventricular thrombus  No Known Allergies  Patient Measurements:   Heparin Dosing Weight: 71.6 kg  Vital Signs: Temp: 98.7 F (37.1 C) (10/16 1800) Temp Source: Oral (10/16 1457) BP: 139/75 (10/16 1800) Pulse Rate: 77 (10/16 2015)  Labs:  Recent Labs  08/22/17 1757  HGB 12.8*  HCT 37.2*  PLT 352  APTT 31  LABPROT 14.3  INR 1.12  CREATININE 1.32*    Estimated Creatinine Clearance: 50.7 mL/min (A) (by C-G formula based on SCr of 1.32 mg/dL (H)).   Medical History: Past Medical History:  Diagnosis Date  . Diabetes mellitus without complication (HCC)   . Fungal infection of eye 05/12/2017  . Hyperlipidemia   . Ischemic cardiomyopathy 07/06/2017   06/16/2017. [facility].  Yates Decamp, MD.  Coronary artery bypass graft.  CKD stage 3 due to type 2 diabetes mellitus.  Hyperlipidemia, mixed.  . Ischemic cardiomyopathy   . LBBB (left bundle branch block) 05/12/2017  . Myocardial infarction (HCC)   . 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 Warfarin anticoagulation 05/12/2017    Medications:  Scheduled:  .  stroke: mapping our early stages of recovery book   Does not apply Once  . aspirin EC  81 mg Oral Daily  . clopidogrel  75 mg Oral Daily  . dorzolamide  1 drop Left Eye BID  . [START ON 08/23/2017] insulin aspart  0-15 Units Subcutaneous TID WC  . [START ON 08/23/2017] insulin detemir  15 Units Subcutaneous Daily  . [START ON 08/23/2017] pantoprazole  40 mg Oral Daily  . prednisoLONE acetate  1 drop Left Eye TID  . [START ON 08/23/2017] ramipril  5 mg Oral Daily  . [START ON 08/23/2017] spironolactone  25 mg Oral Daily  . timolol  1 drop Both Eyes BID    Assessment: 70 YOM admitted with trouble speaking. Had CT of head done outpatient today showing bilateral strokes.  Hgb 12.8, HCT, 37.2, Plt  352. No anticoag PTA, stopped warfarin 1 month ago. - clopidogrel and aspirin. Of note, patient had MI in June with left mural thrombus.   Goal of Therapy:  Heparin level 0.3-0.5 units/ml Monitor platelets by anticoagulation protocol: Yes   Plan:  Start heparin infusion at 1000 units/hr  6 hour heparin level, daily heparin level and CBC Monitor S/sx of bleeding  Emeline General, PharmD Candidate  08/22/2017,8:41 PM

## 2017-08-22 NOTE — ED Provider Notes (Signed)
[facility] EMERGENCY DEPARTMENT Provider Note   CSN: [id number] Arrival date & time: 08/22/17  1446     History   Chief Complaint Chief Complaint  Patient presents with  . Stroke Symptoms    HPI Peter Cole is a 66 y.o. male.  HPI Pt presents to the ED because of difficulty with his speech, finding his words.  The symptoms started 1 week ago.  It has persisted.  He denies any other symptoms.  NO weakness.  He is having numbness in his toes.  Patient went to an urgent care today. He had a CT scan as an outpatient. He was told that he had a stroke and was instructed to come to the emergency room. Past Medical History:  Diagnosis Date  . Diabetes mellitus without complication (HCC)   . Fungal infection of eye 05/12/2017  . Hyperlipidemia   . Ischemic cardiomyopathy 07/06/2017   06/16/2017. [facility].  Yates Decamp, MD.  Coronary artery bypass graft.  CKD stage 3 due to type 2 diabetes mellitus.  Hyperlipidemia, mixed.  . Ischemic cardiomyopathy   . LBBB (left bundle branch block) 05/12/2017  . Myocardial infarction (HCC)   . 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 Warfarin anticoagulation 05/12/2017    Patient Active Problem List   Diagnosis Date Noted  . Diabetic polyneuropathy associated with type 2 diabetes mellitus (HCC) 08/22/2017  . Other symptoms and signs involving the nervous system 08/22/2017  . Dysarthria 08/22/2017  . Cerebrovascular accident (CVA) (HCC) 08/22/2017  . Diabetes (HCC) 07/30/2017  . Ischemic cardiomyopathy 07/06/2017  . 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 Myocardial infarction (HCC) 05/12/2017  . Fungal infection of eye 05/12/2017  . History of myocardial infarction 05/12/2017  . Warfarin anticoagulation 05/12/2017  . LBBB (left bundle branch block) 05/12/2017    Past Surgical History:  Procedure Laterality Date  . CORONARY  ARTERY BYPASS GRAFT    . EYE SURGERY    . LEFT TOE AMPUTATION Left        Home Medications    Prior to Admission medications   Medication Sig Start Date End Date Taking? Authorizing Provider  aspirin EC 81 MG tablet Take 81 mg by mouth daily.   Yes [provider]  clopidogrel (PLAVIX) 75 MG tablet Take 75 mg by mouth daily.   Yes [provider]  dorzolamide (TRUSOPT) 2 % ophthalmic solution Place 1 drop into the left eye 2 (two) times daily.   Yes [provider]  Insulin Detemir (LEVEMIR FLEXPEN) 100 UNIT/ML Pen Inject 30 Units into the skin every morning. And pen needles 1/day 07/28/17  Yes Romero Belling, MD  pantoprazole (PROTONIX) 40 MG tablet Take 40 mg by mouth daily.   Yes [provider]  prednisoLONE acetate (PRED FORTE) 1 % ophthalmic suspension Place 1 drop into the left eye 3 (three) times daily.   Yes [provider]  ramipril (ALTACE) 5 MG capsule Take 1 capsule (5 mg total) by mouth daily. 08/07/17  Yes Wallis Bamberg, PA-C  spironolactone (ALDACTONE) 25 MG tablet Take 1 tablet (25 mg total) by mouth daily. 08/07/17  Yes Wallis Bamberg, PA-C  timolol (BETIMOL) 0.5 % ophthalmic solution Place 1 drop into both eyes 2 (two) times daily.   Yes [provider]  polyvinyl alcohol (LIQUIFILM TEARS) 1.4 % ophthalmic solution Place 1 drop into both eyes as needed for dry eyes.  [provider]    Family History Family History  Problem Relation Age of Onset  . Heart failure Mother   . Diabetes Mother   . Heart attack Father     Social History Social History  Substance Use Topics  . Smoking status: Former Games developer  . Smokeless tobacco: Never Used     Comment: 40 years ago  . Alcohol use No     Allergies   Patient has no known allergies.   Review of Systems Review of Systems  All other systems reviewed and are negative.    Physical Exam Updated Vital Signs BP (!) 155/77   Pulse 71   Temp 98.7 F (37.1  C)   Resp 15   SpO2 99%   Physical Exam  Constitutional: He is oriented to person, place, and time. He appears well-developed and well-nourished. No distress.  HENT:  Head: Normocephalic and atraumatic.  Right Ear: External ear normal.  Left Ear: External ear normal.  Mouth/Throat: Oropharynx is clear and moist.  Eyes: Conjunctivae are normal. Right eye exhibits no discharge. Left eye exhibits no discharge. No scleral icterus.  Neck: Neck supple. No tracheal deviation present.  Cardiovascular: Normal rate, regular rhythm and intact distal pulses.   Pulmonary/Chest: Effort normal and breath sounds normal. No stridor. No respiratory distress. He has no wheezes. He has no rales.  Abdominal: Soft. Bowel sounds are normal. He exhibits no distension. There is no tenderness. There is no rebound and no guarding.  Musculoskeletal: He exhibits no edema or tenderness.  Neurological: He is alert and oriented to person, place, and time. He has normal strength. No cranial nerve deficit (no facial droop, extraocular movements intact, no slurred speech) or sensory deficit. He exhibits normal muscle tone. He displays no seizure activity. Coordination normal.  No pronator drift bilateral upper extrem, able to hold both legs off bed for 5 seconds, sensation intact in all extremities, no visual field cuts, no left or right sided neglect, normal finger-nose exam bilaterally, no nystagmus noted, able to identify all of the objects on the nih stroke scale, pt had difficulty describing the graphical image of the woman washing the dishes   Skin: Skin is warm and dry. No rash noted. He is not diaphoretic.  Psychiatric: He has a normal mood and affect.  Nursing note and vitals reviewed.    ED Treatments / Results  Labs (all labs ordered are listed, but only abnormal results are displayed) Labs Reviewed  CBC - Abnormal; Notable for the following:       Result Value   Hemoglobin 12.8 (*)    HCT 37.2 (*)    All  other components within normal limits  URINALYSIS, ROUTINE W REFLEX MICROSCOPIC - Abnormal; Notable for the following:    APPearance HAZY (*)    Protein, ur 100 (*)    Squamous Epithelial / LPF 0-5 (*)    All other components within normal limits  PROTIME-INR  APTT  DIFFERENTIAL  ETHANOL  COMPREHENSIVE METABOLIC PANEL  RAPID URINE DRUG SCREEN, HOSP PERFORMED  I-STAT TROPONIN, ED    EKG  EKG Interpretation  Date/Time:  Tuesday August 22 2017 15:00:16 EDT Ventricular Rate:  75 PR Interval:  206 QRS Duration: 152 QT Interval:  444 QTC Calculation: 495 R Axis:   -10 Text Interpretation:  Normal sinus rhythm Left bundle branch block Abnormal ECG No old tracing to compare Confirmed by Linwood Dibbles [phone]) on 08/22/2017 6:17:36 PM       Radiology Ct Head  Wo Contrast  Result Date: 08/22/2017 CLINICAL DATA:  Intermittent garbled speech, tingling in the legs, diabetes with neuropathy EXAM: CT HEAD WITHOUT CONTRAST TECHNIQUE: Contiguous axial images were obtained from the base of the skull through the vertex without intravenous contrast. COMPARISON:  CT brain of 01/22/2007 FINDINGS: Brain: There are new relatively well defined peripheral low-attenuation areas in the posterior left parietal infarct posterior right parietal lobes. No hemorrhage is seen in these areas are most consistent with edema secondary to nonhemorrhagic infarct. No mass effect is noted. The ventricular system is normal in size and configuration and the septum is midline in position. The fourth ventricle and basilar cisterns are unremarkable. Vascular: No vascular abnormality is seen on this unenhanced study. Skull: On bone window images, no calvarial abnormality is noted. Sinuses/Orbits: The paranasal sinuses appear well pneumatized. Other: None. IMPRESSION: New peripheral low-attenuation areas within the posterior left parietal and posterior right parietal lobes consistent with nonhemorrhagic infarcts, acute or subacute.  MRI may be helpful for further assessment. Electronically Signed   By: Dwyane Dee M.D.   On: 08/22/2017 13:47    Procedures Procedures (including critical care time)  Medications Ordered in ED Medications - No data to display   Initial Impression / Assessment and Plan / ED Course  I have reviewed the triage vital signs and the nursing notes.  Pertinent labs & imaging results that were available during my care of the patient were reviewed by me and considered in my medical decision making (see chart for details).   Pt presented to the ED for further evaluation of a stroke.  Outside TPA window, sx started over a week ago.  CT scan shows bilateral strokes.  Concerning for some type of embolic phenomena.  No dysrhythmia noted on EKG.   Will consult with medical service and neurology for admission, further evaluation.  Final Clinical Impressions(s) / ED Diagnoses   Final diagnoses:  Cerebral infarction, unspecified mechanism (HCC)     Linwood Dibbles, MD 08/22/17 [facility]

## 2017-08-22 NOTE — Progress Notes (Signed)
Peter Cole 66 y.o.   Chief Complaint  Patient presents with  . Numbness    both legs and tongue x 1 week    HISTORY OF PRESENT ILLNESS: This is a 66 y.o. male complaining of numbness to both feet x several months; has h/o DM on insulin; also c/o slurred speech and not able to get words out correctly x 1 week. Denies headache, head trauma, extremity weakness, syncope, n/v, blurred vision, balance problems, dizziness, vertigo, fever, recent infection. No other significant symptoms.  HPI   Prior to Admission medications   Medication Sig Start Date End Date Taking? Authorizing Provider  aspirin EC 81 MG tablet Take 81 mg by mouth daily.   Yes [provider]  clopidogrel (PLAVIX) 75 MG tablet Take 75 mg by mouth daily.   Yes [provider]  dorzolamide (TRUSOPT) 2 % ophthalmic solution Place 1 drop into the left eye 2 (two) times daily.   Yes [provider]  Insulin Detemir (LEVEMIR FLEXPEN) 100 UNIT/ML Pen Inject 30 Units into the skin every morning. And pen needles 1/day 07/28/17  Yes Romero Belling, MD  prednisoLONE acetate (PRED FORTE) 1 % ophthalmic suspension Place 1 drop into the left eye 3 (three) times daily.   Yes [provider]  ramipril (ALTACE) 5 MG capsule Take 1 capsule (5 mg total) by mouth daily. 08/07/17  Yes Wallis Bamberg, PA-C  spironolactone (ALDACTONE) 25 MG tablet Take 1 tablet (25 mg total) by mouth daily. 08/07/17  Yes Wallis Bamberg, PA-C  pantoprazole (PROTONIX) 40 MG tablet Take 40 mg by mouth daily.    [provider]  polyvinyl alcohol (LIQUIFILM TEARS) 1.4 % ophthalmic solution Place 1 drop into both eyes as needed for dry eyes.    [provider]  timolol (BETIMOL) 0.5 % ophthalmic solution Place 1 drop into both eyes 2 (two) times daily.    [provider]    No Known Allergies  Patient Active Problem List   Diagnosis Date Noted  . Diabetes (HCC) 07/30/2017  . Ischemic cardiomyopathy  07/06/2017  . 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 Myocardial infarction (HCC) 05/12/2017  . Fungal infection of eye 05/12/2017  . History of myocardial infarction 05/12/2017  . Warfarin anticoagulation 05/12/2017  . LBBB (left bundle branch block) 05/12/2017    Past Medical History:  Diagnosis Date  . Diabetes mellitus without complication (HCC)   . Fungal infection of eye 05/12/2017  . Hyperlipidemia   . Ischemic cardiomyopathy 07/06/2017   06/16/2017. [facility].  Yates Decamp, MD.  Coronary artery bypass graft.  CKD stage 3 due to type 2 diabetes mellitus.  Hyperlipidemia, mixed.  . Ischemic cardiomyopathy   . LBBB (left bundle branch block) 05/12/2017  . Myocardial infarction (HCC)   . 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 Warfarin anticoagulation 05/12/2017    Past Surgical History:  Procedure Laterality Date  . CORONARY ARTERY BYPASS GRAFT    . EYE SURGERY    . LEFT TOE AMPUTATION Left     Social History   Social History  . Marital status: Single    Spouse name: N/A  . Number of children: N/A  . Years of education: N/A   Occupational History  . Not on file.   Social History Main Topics  . Smoking status: Former Games developer  . Smokeless tobacco: Never Used     Comment: 40 years ago  .  Alcohol use No  . Drug use: No  . Sexual activity: Not on file   Other Topics Concern  . Not on file   Social History Narrative  . No narrative on file    Family History  Problem Relation Age of Onset  . Heart failure Mother   . Diabetes Mother   . Heart attack Father      ROS  Vitals:   08/22/17 1010  BP: (!) 112/58  Pulse: 76  Resp: 16  Temp: 98.2 F (36.8 C)  SpO2: 97%    Physical Exam  Constitutional: He is oriented to person, place, and time. He appears well-developed and well-nourished.  HENT:  Head: Normocephalic and atraumatic.  Eyes: Conjunctivae and EOM  are normal.  Neck: Normal range of motion. Neck supple. No JVD present.  Cardiovascular: Normal rate, regular rhythm, normal heart sounds and intact distal pulses.   Pulmonary/Chest: Effort normal and breath sounds normal.  Abdominal: Soft. Bowel sounds are normal. He exhibits no distension. There is no tenderness.  Musculoskeletal: Normal range of motion.  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  Lymphadenopathy:    He has no cervical adenopathy.  Neurological: He is alert and oriented to person, place, and time. No cranial nerve deficit or sensory deficit. He exhibits normal muscle tone. Coordination normal.  Reflex Scores:      Bicep reflexes are 2+ on the right side and 2+ on the left side.      Brachioradialis reflexes are 2+ on the right side and 2+ on the left side.      Patellar reflexes are 0 on the right side and 0 on the left side.      Achilles reflexes are 2+ on the right side and 2+ on the left side. dysarthric speech  Skin: Skin is warm and dry. Capillary refill takes less than 2 seconds.  Psychiatric: He has a normal mood and affect. His behavior is normal.  Vitals reviewed.   Ct Head Wo Contrast  Result Date: 08/22/2017 CLINICAL DATA:  Intermittent garbled speech, tingling in the legs, diabetes with neuropathy EXAM: CT HEAD WITHOUT CONTRAST TECHNIQUE: Contiguous axial images were obtained from the base of the skull through the vertex without intravenous contrast. COMPARISON:  CT brain of 01/22/2007 FINDINGS: Brain: There are new relatively well defined peripheral low-attenuation areas in the posterior left parietal infarct posterior right parietal lobes. No hemorrhage is seen in these areas are most consistent with edema secondary to nonhemorrhagic infarct. No mass effect is noted. The ventricular system is normal in size and configuration and the septum is midline in position. The fourth  ventricle and basilar cisterns are unremarkable. Vascular: No vascular abnormality is seen on this unenhanced study. Skull: On bone window images, no calvarial abnormality is noted. Sinuses/Orbits: The paranasal sinuses appear well pneumatized. Other: None. IMPRESSION: New peripheral low-attenuation areas within the posterior left parietal and posterior right parietal lobes consistent with nonhemorrhagic infarcts, acute or subacute. MRI may be helpful for further assessment. Electronically Signed   By: Dwyane Dee M.D.   On: 08/22/2017 13:47    ASSESSMENT & PLAN:  Peter Cole was seen today for numbness.  Diagnoses and all orders for this visit:  Cerebrovascular accident (CVA), unspecified mechanism (HCC) Comments: acute vs subacute  Type 2 diabetes mellitus with diabetic neuropathy, with long-term current use of insulin (HCC) -     CBC with Differential/Platelet -     Comprehensive metabolic  panel -     Hemoglobin A1c  Diabetic polyneuropathy associated with type 2 diabetes mellitus (HCC)  Other symptoms and signs involving the nervous system -     CBC with Differential/Platelet -     Comprehensive metabolic panel -     CT Head Wo Contrast; Future  Dysarthria   To ER for further evaluation, neurological evaluation, and likely admission. A total of 40 minutes was spent in the room with the patient, greater than 50% of which was in counseling/coordination of care regarding DDx and possibility of CVA.   Edwina Barth, MD [facility]

## 2017-08-22 NOTE — ED Triage Notes (Signed)
Pt was sent to ED after having a CT Scan of his head today. Pt went to [facility] today for eval of feeling like he was having trouble speaking and feeling like his tongue is rolling for the past week. Pt was called by MD after the CT Scan and told to come to ED. Pt alert and oriented per spanish interpreter [phone]. Pt denies HA. No vomiting. No CP. Pt is wearing a life vest since heart cath which was done in July. Pt has a Development worker, international aid in [place] who is monitoring pts vest

## 2017-08-23 ENCOUNTER — Encounter: Payer: Self-pay | Admitting: Emergency Medicine

## 2017-08-23 ENCOUNTER — Inpatient Hospital Stay (HOSPITAL_COMMUNITY): Payer: Medicare Other

## 2017-08-23 DIAGNOSIS — I639 Cerebral infarction, unspecified: Secondary | ICD-10-CM

## 2017-08-23 DIAGNOSIS — I5030 Unspecified diastolic (congestive) heart failure: Secondary | ICD-10-CM

## 2017-08-23 DIAGNOSIS — E118 Type 2 diabetes mellitus with unspecified complications: Secondary | ICD-10-CM

## 2017-08-23 LAB — CBC WITH DIFFERENTIAL/PLATELET
BASOS: 1 %
Basophils Absolute: 0.1 10*3/uL (ref 0.0–0.2)
EOS (ABSOLUTE): 0.2 10*3/uL (ref 0.0–0.4)
EOS: 3 %
HEMATOCRIT: 36.4 % — AB (ref 37.5–51.0)
Hemoglobin: 11.8 g/dL — ABNORMAL LOW (ref 13.0–17.7)
IMMATURE GRANULOCYTES: 0 %
Immature Grans (Abs): 0 10*3/uL (ref 0.0–0.1)
LYMPHS ABS: 2.1 10*3/uL (ref 0.7–3.1)
Lymphs: 35 %
MCH: 28.9 pg (ref 26.6–33.0)
MCHC: 32.4 g/dL (ref 31.5–35.7)
MCV: 89 fL (ref 79–97)
MONOS ABS: 0.4 10*3/uL (ref 0.1–0.9)
Monocytes: 6 %
NEUTROS ABS: 3.2 10*3/uL (ref 1.4–7.0)
NEUTROS PCT: 55 %
Platelets: 366 10*3/uL (ref 150–379)
RBC: 4.09 x10E6/uL — ABNORMAL LOW (ref 4.14–5.80)
RDW: 16.4 % — AB (ref 12.3–15.4)
WBC: 5.9 10*3/uL (ref 3.4–10.8)

## 2017-08-23 LAB — GLUCOSE, CAPILLARY
Glucose-Capillary: 102 mg/dL — ABNORMAL HIGH (ref 65–99)
Glucose-Capillary: 99 mg/dL (ref 65–99)
Glucose-Capillary: 137 mg/dL — ABNORMAL HIGH (ref 65–99)

## 2017-08-23 LAB — LIPID PANEL
CHOL/HDL RATIO: 4.5 ratio
Cholesterol: 135 mg/dL (ref 0–200)
HDL: 30 mg/dL — ABNORMAL LOW (ref >40)
LDL Cholesterol: 80 mg/dL (ref 0–99)
Triglycerides: 126 mg/dL (ref <150)
VLDL: 25 mg/dL (ref 0–40)

## 2017-08-23 LAB — HEPARIN LEVEL (UNFRACTIONATED)
HEPARIN UNFRACTIONATED: 0.26 [IU]/mL — AB (ref 0.30–0.70)
Heparin Unfractionated: 0.58 IU/mL (ref 0.30–0.70)

## 2017-08-23 LAB — COMPREHENSIVE METABOLIC PANEL
A/G RATIO: 1.3 (ref 1.2–2.2)
ALT: 18 IU/L (ref 0–44)
AST: 25 IU/L (ref 0–40)
Albumin: 4 g/dL (ref 3.6–4.8)
Alkaline Phosphatase: 71 IU/L (ref 39–117)
BUN/Creatinine Ratio: 28 — ABNORMAL HIGH (ref 10–24)
BUN: 32 mg/dL — ABNORMAL HIGH (ref 8–27)
Bilirubin Total: 0.4 mg/dL (ref 0.0–1.2)
CALCIUM: 9.5 mg/dL (ref 8.6–10.2)
CO2: 22 mmol/L (ref 20–29)
Chloride: 101 mmol/L (ref 96–106)
Creatinine, Ser: 1.16 mg/dL (ref 0.76–1.27)
GFR, EST AFRICAN AMERICAN: 76 mL/min/{1.73_m2} (ref >59)
GFR, EST NON AFRICAN AMERICAN: 66 mL/min/{1.73_m2} (ref >59)
GLOBULIN, TOTAL: 3.1 g/dL (ref 1.5–4.5)
Glucose: 240 mg/dL — ABNORMAL HIGH (ref 65–99)
POTASSIUM: 4.5 mmol/L (ref 3.5–5.2)
SODIUM: 138 mmol/L (ref 134–144)
Total Protein: 7.1 g/dL (ref 6.0–8.5)

## 2017-08-23 LAB — ECHOCARDIOGRAM COMPLETE
HEIGHTINCHES: 62.25 in
WEIGHTICAEL: 2739.2 [oz_av]

## 2017-08-23 LAB — CBC
HCT: 33.8 % — ABNORMAL LOW (ref 39.0–52.0)
Hemoglobin: 11.3 g/dL — ABNORMAL LOW (ref 13.0–17.0)
MCH: 28.5 pg (ref 26.0–34.0)
MCHC: 33.4 g/dL (ref 30.0–36.0)
MCV: 85.1 fL (ref 78.0–100.0)
PLATELETS: 358 10*3/uL (ref 150–400)
RBC: 3.97 MIL/uL — ABNORMAL LOW (ref 4.22–5.81)
RDW: 14.7 % (ref 11.5–15.5)
WBC: 7.3 10*3/uL (ref 4.0–10.5)

## 2017-08-23 LAB — HEMOGLOBIN A1C
ESTIMATED AVERAGE GLUCOSE: 197 mg/dL
HEMOGLOBIN A1C: 8.1 % — AB (ref 4.8–5.6)
Hgb A1c MFr Bld: 8.5 % — ABNORMAL HIGH (ref 4.8–5.6)
MEAN PLASMA GLUCOSE: 185.77 mg/dL

## 2017-08-23 MED ORDER — ATORVASTATIN CALCIUM 40 MG PO TABS
40.0000 mg | ORAL_TABLET | Freq: Every day | ORAL | Status: DC
  Administered 2017-08-23 – 2017-08-25 (×3): 40 mg via ORAL
  Filled 2017-08-23 (×3): qty 1

## 2017-08-23 MED ORDER — PERFLUTREN LIPID MICROSPHERE
1.0000 mL | INTRAVENOUS | Status: AC | PRN
  Administered 2017-08-23: 2 mL via INTRAVENOUS
  Filled 2017-08-23: qty 10

## 2017-08-23 NOTE — Evaluation (Signed)
Physical Therapy Evaluation Patient Details Name: Peter Cole MRN: [id number] DOB: 02/23/1951 Today's Date: 08/23/2017   History of Present Illness  66 yo male presenting to ED with c/o 1-2 weeks of difficulty with word finding and speech. PMH including CAD s/p CABG and MI in June 2018. MRI showing acute ischemic infarct within the posterior left middle cerebral artery territory, predominantly involving the left parietal cortex.  Clinical Impression  Spanish video interpreter utilized throughout.  Pt presented supine in bed with HOB elevated, awake and willing to participate in therapy session. Prior to admission, pt reported that he was independent with all functional mobility and ADLs. Pt refusing to further participate in gait assessment or higher level balance testing as he reported that he had just ambulated with "the other therapist" and did not understand why he needed to go through it again. Pt performed bed mobility independently and stated that he feels he is back to his baseline with the exception of his speech. PT will continue to follow acutely in hopes that pt with participate in further mobility and balance assessment to make appropriate f/u recommendations.     Follow Up Recommendations Other (comment) (TBD based on further mobility assessment)    Equipment Recommendations  Other (comment) (TBD based on further mobility assessment)    Recommendations for Other Services       Precautions / Restrictions Precautions Precautions: Fall Restrictions Weight Bearing Restrictions: No      Mobility  Bed Mobility Overal bed mobility: Independent Bed Mobility: Supine to Sit     Supine to sit: Supervision     General bed mobility comments: HOB flat, no use of rails  Transfers Overall transfer level: Needs assistance Equipment used: None Transfers: Sit to/from Stand Sit to Stand: Supervision         General transfer comment: pt reporting that he has already done  this with "the other therapist" earlier and refusing at this time; his concern is his speech only  Ambulation/Gait                Stairs            Wheelchair Mobility    Modified Rankin (Stroke Patients Only) Modified Rankin (Stroke Patients Only) Pre-Morbid Rankin Score: No symptoms Modified Rankin: Slight disability     Balance Overall balance assessment: Needs assistance Sitting-balance support: No upper extremity supported Sitting balance-Leahy Scale: Normal                                       Pertinent Vitals/Pain Pain Assessment: No/denies pain    Home Living Family/patient expects to be discharged to:: Private residence Living Arrangements: Non-relatives/Friends (roommates) Available Help at Discharge: Friend(s);Available 24 hours/day Type of Home: Apartment (2nd floor) Home Access: Stairs to enter   Entrance Stairs-Number of Steps: flight          Prior Function Level of Independence: Independent         Comments: ADLs, IADLs     Hand Dominance   Dominant Hand: Right    Extremity/Trunk Assessment   Upper Extremity Assessment Upper Extremity Assessment: Defer to OT evaluation LUE Deficits / Details: Slight decreased coordination of LUE and L hand    Lower Extremity Assessment Lower Extremity Assessment: Defer to PT evaluation    Cervical / Trunk Assessment Cervical / Trunk Assessment: Normal  Communication   Communication: Prefers language other than [place];Other (comment) (video  interpreter used)  Cognition Arousal/Alertness: Awake/alert Behavior During Therapy: WFL for tasks assessed/performed Overall Cognitive Status: Within Functional Limits for tasks assessed                                        General Comments General comments (skin integrity, edema, etc.): used spanish video interpreter    Exercises     Assessment/Plan    PT Assessment Patient needs continued PT services  (further higher level balance assessment)  PT Problem List Decreased balance;Decreased mobility;Decreased coordination       PT Treatment Interventions DME instruction;Gait training;Stair training;Functional mobility training;Therapeutic activities;Therapeutic exercise;Balance training;Neuromuscular re-education;Patient/family education    PT Goals (Current goals can be found in the Care Plan section)  Acute Rehab PT Goals Patient Stated Goal: Go home PT Goal Formulation: With patient Time For Goal Achievement: 09/06/17 Potential to Achieve Goals: Good    Frequency Min 3X/week   Barriers to discharge        Co-evaluation               AM-PAC PT "6 Clicks" Daily Activity  Outcome Measure Difficulty turning over in bed (including adjusting bedclothes, sheets and blankets)?: None Difficulty moving from lying on back to sitting on the side of the bed? : None Difficulty sitting down on and standing up from a chair with arms (e.g., wheelchair, bedside commode, etc,.)?: A Little Help needed moving to and from a bed to chair (including a wheelchair)?: None Help needed walking in hospital room?: A Little Help needed climbing 3-5 steps with a railing? : A Little 6 Click Score: 21    End of Session   Activity Tolerance: Patient tolerated treatment well Patient left: in bed;with call bell/phone within reach Nurse Communication: Mobility status PT Visit Diagnosis: Other symptoms and signs involving the nervous system (O83.254)    Time: 9826-4158 PT Time Calculation (min) (ACUTE ONLY): 13 min   Charges:   PT Evaluation $PT Eval Moderate Complexity: 1 Mod     PT G Codes:        [place], PT, DPT [phone]   [name] Evanell Redlich 08/23/2017, 5:31 PM

## 2017-08-23 NOTE — Progress Notes (Signed)
ANTICOAGULATION CONSULT NOTE   Pharmacy Consult for heparin dosing Indication:  CVA  No Known Allergies  Patient Measurements:   Heparin Dosing Weight: 71.6 kg  Vital Signs: Temp: 98.8 F (37.1 C) (10/17 0528) Temp Source: Oral (10/17 0528) BP: 108/55 (10/17 0528) Pulse Rate: 57 (10/17 0528)  Labs:  Recent Labs  08/22/17 1131 08/22/17 1757 08/23/17 0400  HGB 11.8* 12.8* 11.3*  HCT 36.4* 37.2* 33.8*  PLT 366 352 358  APTT  --  31  --   LABPROT  --  14.3  --   INR  --  1.12  --   HEPARINUNFRC  --   --  0.26*  CREATININE 1.16 1.32*  --     Estimated Creatinine Clearance: 50.7 mL/min (A) (by C-G formula based on SCr of 1.32 mg/dL (H)).  Assessment: 66 y.o. male with CVA and h/o mural thrombus for heparin  Goal of Therapy:  Heparin level 0.3-0.5 units/ml Monitor platelets by anticoagulation protocol: Yes   Plan:  Increase Heparin 1100 units/hr Check heparin level in 8 hours.  Peter Cole, Gary Fleet 08/23/2017,5:34 AM

## 2017-08-23 NOTE — Progress Notes (Signed)
PT Cancellation Note  Patient Details Name: Peter Cole MRN: [id number] DOB: Jun 17, 1951   Cancelled Treatment:    Reason Eval/Treat Not Completed: Patient at procedure or test/unavailable. Pt off of the floor for echo lab. PT will continue to f/u with pt as available.   [name] [name] 08/23/2017, 10:46 AM

## 2017-08-23 NOTE — Progress Notes (Signed)
  Echocardiogram 2D Echocardiogram has been performed.  Peter Cole 08/23/2017, 11:09 AM

## 2017-08-23 NOTE — Progress Notes (Signed)
Patient arrived from [facility] ED a little after 2200, alert and oriented X 4 slight facial asymmetry  and  Dysphagia was reported from ED report but very difficult to assess due to language barrier. Will continue to assess.

## 2017-08-23 NOTE — Progress Notes (Signed)
*  PRELIMINARY RESULTS* Vascular Ultrasound Carotid Duplex (Doppler) has been completed.  Preliminary findings: Bilateral: No significant ICA stenosis. Antegrade vertebral flow.    Farrel Demark, RDMS, RVT   08/23/2017, 10:18 AM

## 2017-08-23 NOTE — Consult Note (Signed)
Referring Physician: Dr. Alcario Drought    Chief Complaint: Dysarthria and expressive aphasia  HPI: Peter Cole is an 66 y.o. male with a PMHx of low EF of 15-20% on echocardiogram, large left mural thrombus, CAD s/p CABG and MI who presents with dysarthria and expressive aphasia approximately one month after stopping Coumadin. The patient stated that his doctor told him to stop taking it, but this is likely due to a misunderstanding of instructions. He has remained on ASA and Plavix.   He presented to the ED on Tuesday from his PCPs office for stroke evaluation. He has had a 1 week history of difficulty with speech, including word-finding problems and dysarthria. He states in Spanish to the interpreter that he has had trouble getting the words out, but can understand what is being said by others. He had a CT performed at [facility] on Tuesday which revealed findings consistent with subacute strokes.   PMHx also includes DM, HLD, ischemic cardiomyopathy, and CKD3.   Past Medical History:  Diagnosis Date  . Diabetes mellitus without complication ([place])   . Fungal infection of eye 05/12/2017  . Hyperlipidemia   . Ischemic cardiomyopathy 07/06/2017   06/16/2017. [facility].  Adrian Prows, MD.  Coronary artery bypass graft.  CKD stage 3 due to type 2 diabetes mellitus.  Hyperlipidemia, mixed.  . Ischemic cardiomyopathy   . LBBB (left bundle branch block) 05/12/2017  . Myocardial infarction ([place])   . Stage 3 chronic kidney disease due to type 2 diabetes mellitus ([place]) 06/29/2017  . Warfarin anticoagulation 05/12/2017    Past Surgical History:  Procedure Laterality Date  . CORONARY ARTERY BYPASS GRAFT    . EYE SURGERY    . LEFT TOE AMPUTATION Left     Family History  Problem Relation Age of Onset  . Heart failure Mother   . Diabetes Mother   . Heart attack Father    Social History:  reports that he has quit smoking. He has never used smokeless tobacco. He reports that he does not drink  alcohol or use drugs.  Allergies: No Known Allergies  Medications:  Prior to Admission:  Prescriptions Prior to Admission  Medication Sig Dispense Refill Last Dose  . aspirin EC 81 MG tablet Take 81 mg by mouth daily.   08/21/2017 at Unknown time  . clopidogrel (PLAVIX) 75 MG tablet Take 75 mg by mouth daily.   08/21/2017 at 0900  . dorzolamide (TRUSOPT) 2 % ophthalmic solution Place 1 drop into the left eye 2 (two) times daily.   08/21/2017 at Unknown time  . Insulin Detemir (LEVEMIR FLEXPEN) 100 UNIT/ML Pen Inject 30 Units into the skin every morning. And pen needles 1/day 15 mL 11 08/21/2017 at Unknown time  . pantoprazole (PROTONIX) 40 MG tablet Take 40 mg by mouth daily.   08/21/2017 at Unknown time  . prednisoLONE acetate (PRED FORTE) 1 % ophthalmic suspension Place 1 drop into the left eye 3 (three) times daily.   08/21/2017 at Unknown time  . ramipril (ALTACE) 5 MG capsule Take 1 capsule (5 mg total) by mouth daily. 90 capsule 1 08/21/2017 at Unknown time  . spironolactone (ALDACTONE) 25 MG tablet Take 1 tablet (25 mg total) by mouth daily. 90 tablet 1 08/21/2017 at Unknown time  . timolol (BETIMOL) 0.5 % ophthalmic solution Place 1 drop into both eyes 2 (two) times daily.   08/21/2017 at Unknown time  . polyvinyl alcohol (LIQUIFILM TEARS) 1.4 % ophthalmic solution Place 1 drop into both eyes as needed  for dry eyes.    at prn   Scheduled: . aspirin EC  81 mg Oral Daily  . clopidogrel  75 mg Oral Daily  . dorzolamide  1 drop Left Eye BID  . insulin aspart  0-15 Units Subcutaneous TID WC  . insulin detemir  15 Units Subcutaneous Daily  . pantoprazole  40 mg Oral Daily  . prednisoLONE acetate  1 drop Left Eye TID  . ramipril  5 mg Oral Daily  . spironolactone  25 mg Oral Daily  . timolol  1 drop Both Eyes BID    ROS: No headache, vision changes, chest pain, limb numbness or limb weakness. Other ROS as per HPI.   Physical Examination: Blood pressure (!) 108/55, pulse (!) 57,  temperature 98.8 F (37.1 C), temperature source Oral, resp. rate 18, SpO2 99 %.  HEENT: [place]/AT Lungs: Respirations unlabored Ext: No edema  Neurologic Examination: Mental Status: Alert, fully oriented, thought content appropriate. Pleasant and cooperative without agitation. Spanish translator assists with speech evaluation. Speech fluent with intact comprehension for most commands except for difficulty with a 2-step directional command. Mild difficulty with naming. Prosody intact. Non-tangential.  Cranial Nerves: II:  Visual fields intact. PERRL.  III,IV, VI: Ptosis not present. EOMI without nystagmus.  V,VII: Smile symmetric. Facial light touch sensation normal bilaterally VIII: hearing intact to voice IX,X: No hypophonia or hoarseness XI: Symmetric shoulder shrug XII: midline tongue extension  Motor: Right : Upper extremity   5/5    Left:     Upper extremity   5/5  Lower extremity   5/5     Lower extremity   5/5 Normal tone throughout; no atrophy noted No pronator or parietal drift.  Sensory: Light touch intact x 4. No extinction.  Deep Tendon Reflexes:  2+ and symmetric x 4. Toes downgoing bilaterally.  Cerebellar: No ataxia with FNF bilaterally.  Gait: Deferred   Results for orders placed or performed during the hospital encounter of 08/22/17 (from the past 48 hour(s))  Ethanol     Status: None   Collection Time: 08/22/17  5:57 PM  Result Value Ref Range   Alcohol, Ethyl (B) <10 <10 mg/dL    Comment:        LOWEST DETECTABLE LIMIT FOR SERUM ALCOHOL IS 10 mg/dL FOR MEDICAL PURPOSES ONLY   Protime-INR     Status: None   Collection Time: 08/22/17  5:57 PM  Result Value Ref Range   Prothrombin Time 14.3 11.4 - 15.2 seconds   INR 1.12   APTT     Status: None   Collection Time: 08/22/17  5:57 PM  Result Value Ref Range   aPTT 31 24 - 36 seconds  CBC     Status: Abnormal   Collection Time: 08/22/17  5:57 PM  Result Value Ref Range   WBC 6.8 4.0 - 10.5 K/uL   RBC 4.36  4.22 - 5.81 MIL/uL   Hemoglobin 12.8 (L) 13.0 - 17.0 g/dL   HCT 37.2 (L) 39.0 - 52.0 %   MCV 85.3 78.0 - 100.0 fL   MCH 29.4 26.0 - 34.0 pg   MCHC 34.4 30.0 - 36.0 g/dL   RDW 14.5 11.5 - 15.5 %   Platelets 352 150 - 400 K/uL  Differential     Status: None   Collection Time: 08/22/17  5:57 PM  Result Value Ref Range   Neutrophils Relative % 52 %   Neutro Abs 3.5 1.7 - 7.7 K/uL   Lymphocytes Relative 36 %  Lymphs Abs 2.4 0.7 - 4.0 K/uL   Monocytes Relative 8 %   Monocytes Absolute 0.5 0.1 - 1.0 K/uL   Eosinophils Relative 3 %   Eosinophils Absolute 0.2 0.0 - 0.7 K/uL   Basophils Relative 1 %   Basophils Absolute 0.1 0.0 - 0.1 K/uL  Comprehensive metabolic panel     Status: Abnormal   Collection Time: 08/22/17  5:57 PM  Result Value Ref Range   Sodium 137 135 - 145 mmol/L   Potassium 4.2 3.5 - 5.1 mmol/L   Chloride 105 101 - 111 mmol/L   CO2 24 22 - 32 mmol/L   Glucose, Bld 108 (H) 65 - 99 mg/dL   BUN 29 (H) 6 - 20 mg/dL   Creatinine, Ser 1.32 (H) 0.61 - 1.24 mg/dL   Calcium 9.4 8.9 - 10.3 mg/dL   Total Protein 8.1 6.5 - 8.1 g/dL   Albumin 4.0 3.5 - 5.0 g/dL   AST 35 15 - 41 U/L   ALT 25 17 - 63 U/L   Alkaline Phosphatase 73 38 - 126 U/L   Total Bilirubin 0.5 0.3 - 1.2 mg/dL   GFR calc non Af Amer 55 (L) >60 mL/min   GFR calc Af Amer >60 >60 mL/min    Comment: (NOTE) The eGFR has been calculated using the CKD EPI equation. This calculation has not been validated in all clinical situations. eGFR's persistently <60 mL/min signify possible Chronic Kidney Disease.    Anion gap 8 5 - 15  I-stat troponin, ED (not at [facility])     Status: None   Collection Time: 08/22/17  6:12 PM  Result Value Ref Range   Troponin i, poc 0.00 0.00 - 0.08 ng/mL   Comment 3            Comment: Due to the release kinetics of cTnI, a negative result within the first hours of the onset of symptoms does not rule out myocardial infarction with certainty. If myocardial infarction is still  suspected, repeat the test at appropriate intervals.   Urine rapid drug screen (hosp performed)not at [facility]     Status: None   Collection Time: 08/22/17  6:35 PM  Result Value Ref Range   Opiates NONE DETECTED NONE DETECTED   Cocaine NONE DETECTED NONE DETECTED   Benzodiazepines NONE DETECTED NONE DETECTED   Amphetamines NONE DETECTED NONE DETECTED   Tetrahydrocannabinol NONE DETECTED NONE DETECTED   Barbiturates NONE DETECTED NONE DETECTED    Comment:        DRUG SCREEN FOR MEDICAL PURPOSES ONLY.  IF CONFIRMATION IS NEEDED FOR ANY PURPOSE, NOTIFY LAB WITHIN 5 DAYS.        LOWEST DETECTABLE LIMITS FOR URINE DRUG SCREEN Drug Class       Cutoff (ng/mL) Amphetamine      1000 Barbiturate      200 Benzodiazepine   384 Tricyclics       536 Opiates          300 Cocaine          300 THC              50   Urinalysis, Routine w reflex microscopic (not at [facility])     Status: Abnormal   Collection Time: 08/22/17  6:35 PM  Result Value Ref Range   Color, Urine YELLOW YELLOW   APPearance HAZY (A) CLEAR   Specific Gravity, Urine 1.016 1.005 - 1.030   pH 5.0 5.0 - 8.0   Glucose, UA  NEGATIVE NEGATIVE mg/dL   Hgb urine dipstick NEGATIVE NEGATIVE   Bilirubin Urine NEGATIVE NEGATIVE   Ketones, ur NEGATIVE NEGATIVE mg/dL   Protein, ur 100 (A) NEGATIVE mg/dL   Nitrite NEGATIVE NEGATIVE   Leukocytes, UA NEGATIVE NEGATIVE   RBC / HPF 0-5 0 - 5 RBC/hpf   WBC, UA 0-5 0 - 5 WBC/hpf   Bacteria, UA NONE SEEN NONE SEEN   Squamous Epithelial / LPF 0-5 (A) NONE SEEN   Mucus PRESENT    Hyaline Casts, UA PRESENT   Hemoglobin A1c     Status: Abnormal   Collection Time: 08/23/17  4:00 AM  Result Value Ref Range   Hgb A1c MFr Bld 8.1 (H) 4.8 - 5.6 %    Comment: (NOTE) Pre diabetes:          5.7%-6.4% Diabetes:              >6.4% Glycemic control for   <7.0% adults with diabetes    Mean Plasma Glucose 185.77 mg/dL  Lipid panel     Status: Abnormal   Collection Time: 08/23/17  4:00 AM  Result  Value Ref Range   Cholesterol 135 0 - 200 mg/dL   Triglycerides 126 <150 mg/dL   HDL 30 (L) >40 mg/dL   Total CHOL/HDL Ratio 4.5 RATIO   VLDL 25 0 - 40 mg/dL   LDL Cholesterol 80 0 - 99 mg/dL    Comment:        Total Cholesterol/HDL:CHD Risk Coronary Heart Disease Risk Table                     Men   Women  1/2 Average Risk   3.4   3.3  Average Risk       5.0   4.4  2 X Average Risk   9.6   7.1  3 X Average Risk  23.4   11.0        Use the calculated Patient Ratio above and the CHD Risk Table to determine the patient's CHD Risk.        ATP III CLASSIFICATION (LDL):  <100     mg/dL   Optimal  100-129  mg/dL   Near or Above                    Optimal  130-159  mg/dL   Borderline  160-189  mg/dL   High  >190     mg/dL   Very High   Heparin level (unfractionated)     Status: Abnormal   Collection Time: 08/23/17  4:00 AM  Result Value Ref Range   Heparin Unfractionated 0.26 (L) 0.30 - 0.70 IU/mL    Comment:        IF HEPARIN RESULTS ARE BELOW EXPECTED VALUES, AND PATIENT DOSAGE HAS BEEN CONFIRMED, SUGGEST FOLLOW UP TESTING OF ANTITHROMBIN III LEVELS.   CBC     Status: Abnormal   Collection Time: 08/23/17  4:00 AM  Result Value Ref Range   WBC 7.3 4.0 - 10.5 K/uL   RBC 3.97 (L) 4.22 - 5.81 MIL/uL   Hemoglobin 11.3 (L) 13.0 - 17.0 g/dL   HCT 33.8 (L) 39.0 - 52.0 %   MCV 85.1 78.0 - 100.0 fL   MCH 28.5 26.0 - 34.0 pg   MCHC 33.4 30.0 - 36.0 g/dL   RDW 14.7 11.5 - 15.5 %   Platelets 358 150 - 400 K/uL   Ct Head Wo Contrast  Result Date:  08/22/2017 CLINICAL DATA:  Intermittent garbled speech, tingling in the legs, diabetes with neuropathy EXAM: CT HEAD WITHOUT CONTRAST TECHNIQUE: Contiguous axial images were obtained from the base of the skull through the vertex without intravenous contrast. COMPARISON:  CT brain of 01/22/2007 FINDINGS: Brain: There are new relatively well defined peripheral low-attenuation areas in the posterior left parietal infarct posterior right  parietal lobes. No hemorrhage is seen in these areas are most consistent with edema secondary to nonhemorrhagic infarct. No mass effect is noted. The ventricular system is normal in size and configuration and the septum is midline in position. The fourth ventricle and basilar cisterns are unremarkable. Vascular: No vascular abnormality is seen on this unenhanced study. Skull: On bone window images, no calvarial abnormality is noted. Sinuses/Orbits: The paranasal sinuses appear well pneumatized. Other: None. IMPRESSION: New peripheral low-attenuation areas within the posterior left parietal and posterior right parietal lobes consistent with nonhemorrhagic infarcts, acute or subacute. MRI may be helpful for further assessment. Electronically Signed   By: Ivar Drape M.D.   On: 08/22/2017 13:47   Mr Brain Wo Contrast  Result Date: 08/22/2017 CLINICAL DATA:  Word-finding difficulties. EXAM: MRI HEAD WITHOUT CONTRAST MRA HEAD WITHOUT CONTRAST TECHNIQUE: Multiplanar, multiecho pulse sequences of the brain and surrounding structures were obtained without intravenous contrast. Angiographic images of the head were obtained using MRA technique without contrast. COMPARISON:  None. FINDINGS: MRI HEAD FINDINGS Brain: The midline structures are normal. There is focal, predominantly cortical diffusion restriction within the left parietal lobe, within the posterior left MCA territory. Punctate foci of elevated signal on diffusion-weighted imaging in the posterior right parietal lobe may be artifactual. There is encephalomalacia in the right parietal lobe with subcortical gliosis and areas of hyperintense T1 weighted signal suggesting laminar necrosis. There is gyral swelling in the posterior left MCA territory. No acute hemorrhage. No chronic microhemorrhage or cerebral amyloid angiopathy. No hydrocephalus, age advanced atrophy or lobar predominant volume loss. No dural abnormality or extra-axial collection. Skull and upper  cervical spine: The visualized skull base, calvarium, upper cervical spine and extracranial soft tissues are normal. Sinuses/Orbits: No fluid levels or advanced mucosal thickening. No mastoid effusion. Normal orbits. MRA HEAD FINDINGS Intracranial internal carotid arteries: Normal. Anterior cerebral arteries: Normal. Middle cerebral arteries: Normal. Posterior communicating arteries: Present on the left. Posterior cerebral arteries: Normal. Basilar artery: Normal. Vertebral arteries: Codominant. Normal. Superior cerebellar arteries: Normal. Anterior inferior cerebellar arteries: Normal left Posterior inferior cerebellar arteries: Normal right IMPRESSION: 1. Acute ischemic infarct within the posterior left middle cerebral artery territory, predominantly involving the left parietal cortex. 2. Nonacute right parietal lobe infarct with associated encephalomalacia and laminar necrosis. Areas of increased signal on diffusion-weighted imaging in this distribution are probably artifactual, possibly due to susceptibility effects from mineralization or old blood products at this site. 3. No large vessel occlusion or other significant vascular abnormality on MRA. Electronically Signed   By: Ulyses Jarred M.D.   On: 08/22/2017 21:58   Mr Jodene Nam Head Wo Contrast  Result Date: 08/22/2017 CLINICAL DATA:  Word-finding difficulties. EXAM: MRI HEAD WITHOUT CONTRAST MRA HEAD WITHOUT CONTRAST TECHNIQUE: Multiplanar, multiecho pulse sequences of the brain and surrounding structures were obtained without intravenous contrast. Angiographic images of the head were obtained using MRA technique without contrast. COMPARISON:  None. FINDINGS: MRI HEAD FINDINGS Brain: The midline structures are normal. There is focal, predominantly cortical diffusion restriction within the left parietal lobe, within the posterior left MCA territory. Punctate foci of elevated signal on diffusion-weighted imaging in the  posterior right parietal lobe may be  artifactual. There is encephalomalacia in the right parietal lobe with subcortical gliosis and areas of hyperintense T1 weighted signal suggesting laminar necrosis. There is gyral swelling in the posterior left MCA territory. No acute hemorrhage. No chronic microhemorrhage or cerebral amyloid angiopathy. No hydrocephalus, age advanced atrophy or lobar predominant volume loss. No dural abnormality or extra-axial collection. Skull and upper cervical spine: The visualized skull base, calvarium, upper cervical spine and extracranial soft tissues are normal. Sinuses/Orbits: No fluid levels or advanced mucosal thickening. No mastoid effusion. Normal orbits. MRA HEAD FINDINGS Intracranial internal carotid arteries: Normal. Anterior cerebral arteries: Normal. Middle cerebral arteries: Normal. Posterior communicating arteries: Present on the left. Posterior cerebral arteries: Normal. Basilar artery: Normal. Vertebral arteries: Codominant. Normal. Superior cerebellar arteries: Normal. Anterior inferior cerebellar arteries: Normal left Posterior inferior cerebellar arteries: Normal right IMPRESSION: 1. Acute ischemic infarct within the posterior left middle cerebral artery territory, predominantly involving the left parietal cortex. 2. Nonacute right parietal lobe infarct with associated encephalomalacia and laminar necrosis. Areas of increased signal on diffusion-weighted imaging in this distribution are probably artifactual, possibly due to susceptibility effects from mineralization or old blood products at this site. 3. No large vessel occlusion or other significant vascular abnormality on MRA. Electronically Signed   By: Ulyses Jarred M.D.   On: 08/22/2017 21:58    Assessment: 66 y.o. male with a 1 week history of aphasia secondary to subacute left MCA ischemic infaction 1. Exam reveals subtle receptive and expressive speech deficits.  2. MRI/MRA of head reveals acute ischemic infarct within the posterior left middle  cerebral artery territory, predominantly involving the left parietal cortex. There is also a nonacute right parietal lobe infarct with associated encephalomalacia and laminar necrosis. No large vessel occlusion or other significant vascular abnormality is seen on MRA.  3. Stroke most likely cardioembolic in the setting of recent Coumadin discontinuation and known large left mural thrombus.  4. Additional Stroke Risk Factors - DM, HLD, ischemic cardiomyopathy with low EF of 15-20%, CAD and MI    Plan: 1. HgbA1c, fasting lipid panel 2. Repeat echocardiogram 3. Carotid ultrasound 4. PT consult, OT consult, Speech consult 5. Agree with starting heparin anticoagulation. Relatively low risk of hemorrhagic conversion of stroke given that he is one week out from symptom onset.  6. Continue ASA and Plavix (cardiac indication) 7. Risk factor modification 8. Telemetry monitoring 9. Frequent neuro checks 10. Start atorvastatin 40 mg po qd. Obtain baseline CK level  _0  signed: Dr. Kerney Elbe 08/23/2017, 6:39 AM

## 2017-08-23 NOTE — Progress Notes (Signed)
STROKE TEAM PROGRESS NOTE   HISTORY OF PRESENT ILLNESS (per record) Peter Cole is an 66 y.o. male with a PMHx of low EF of 15-20% on echocardiogram, large left mural thrombus, CAD s/p CABG and MI who presents with dysarthria and expressive aphasia approximately one month after stopping Coumadin. The patient stated that his doctor told him to stop taking it, but this is likely due to a misunderstanding of instructions. He has remained on ASA and Plavix.   He presented to the ED on Tuesday from his PCPs office for stroke evaluation. He has had a 1 week history of difficulty with speech, including word-finding problems and dysarthria. He states in Spanish to the interpreter that he has had trouble getting the words out, but can understand what is being said by others. He had a CT performed at [facility] on Tuesday which revealed findings consistent with subacute strokes.   PMHx also includes DM, HLD, ischemic cardiomyopathy, and CKD3.   Patient was not administered IV t-PA secondary to timing of presentation.   SUBJECTIVE (INTERVAL HISTORY) He is sitting up after finishing session with SLP therapists who assist with Spanish language interpretation. He reports feeling his speech is much improved and carries on the conversation without difficulty. He reports that his anticoagulation was discontinued after seeing his doctor in clinic. He thinks this was Dr. Verl DickerGanji's office at a September appointment but seems slightly unclear about this. He was previously on coumadin for a mural thrombus after MI in June.   OBJECTIVE CBC:   Recent Labs Lab 08/22/17 1131 08/22/17 1757 08/23/17 0400  WBC 5.9 6.8 7.3  NEUTROABS 3.2 3.5  --   HGB 11.8* 12.8* 11.3*  HCT 36.4* 37.2* 33.8*  MCV 89 85.3 85.1  PLT 366 352 358    Basic Metabolic Panel:   Recent Labs Lab 08/22/17 1131 08/22/17 1757  NA 138 137  K 4.5 4.2  CL 101 105  CO2 22 24  GLUCOSE 240* 108*  BUN 32* 29*  CREATININE 1.16 1.32*   CALCIUM 9.5 9.4    Lipid Panel:     Component Value Date/Time   CHOL 135 08/23/2017 0400   CHOL 147 08/07/2017 1039   TRIG 126 08/23/2017 0400   HDL 30 (L) 08/23/2017 0400   HDL 33 (L) 08/07/2017 1039   CHOLHDL 4.5 08/23/2017 0400   VLDL 25 08/23/2017 0400   LDLCALC 80 08/23/2017 0400   LDLCALC 70 08/07/2017 1039   HgbA1c:  Lab Results  Component Value Date   HGBA1C 8.1 (H) 08/23/2017   Urine Drug Screen:     Component Value Date/Time   LABOPIA NONE DETECTED 08/22/2017 1835   COCAINSCRNUR NONE DETECTED 08/22/2017 1835   LABBENZ NONE DETECTED 08/22/2017 1835   AMPHETMU NONE DETECTED 08/22/2017 1835   THCU NONE DETECTED 08/22/2017 1835   LABBARB NONE DETECTED 08/22/2017 1835    Alcohol Level     Component Value Date/Time   ETH <10 08/22/2017 1757    IMAGING  Ct Head Wo Contrast 08/22/2017 New peripheral low-attenuation areas within the posterior left parietal and posterior right parietal lobes consistent with nonhemorrhagic infarcts, acute or subacute. MRI may be helpful for further assessment.  Mr Brain 36Wo Contrast Mr Peter GlennMra Head Wo Contrast 08/22/2017 1. Acute ischemic infarct within the posterior left middle cerebral artery territory, predominantly involving the left parietal cortex. 2. Nonacute right parietal lobe infarct with associated encephalomalacia and laminar necrosis. Areas of increased signal on diffusion-weighted imaging in this distribution are probably artifactual, possibly  due to susceptibility effects from mineralization or old blood products at this site. 3. No large vessel occlusion or other significant vascular abnormality on MRA.  2D Echocardiogram 08/23/2017 LVEF 30-35%. Akinesis of the anterior wall, septum and apex; overall moderate to severe LV dysfunction; mild diastolic dysfunction; mild LVE; no LV thrombus noted using definity; mild LAE; trace MR and TR.  Carotid Doppler US 08/23/2017 Pending  PHYSICAL EXAM Temp:  [98 F (36.7  C)-98.8 F (37.1 C)] 98 F (36.7 C) (10/17 1411) Pulse Rate:  [57-87] 77 (10/17 1411) Resp:  [12-23] 18 (10/17 1411) BP: (95-155)/(42-132) 95/51 (10/17 1411) SpO2:  [96 %-100 %] 97 % (10/17 1411)  General - Well nourished, well developed, in no apparent distress.  Mental Status -  Language including expression, naming, repetition, comprehension was assessed and found intact. But has some word hesitancy and dysfluency Attention span and concentration were normal. Recent and remote memory were intact.  Cranial Nerves II - XII - II - Visual field intact OU. III, IV, VI - Extraocular movements intact. V - Facial sensation intact bilaterally. VII - Facial movement intact bilaterally. VIII - Hearing & vestibular intact bilaterally. X - Palate elevates symmetrically. XI - Chin turning & shoulder shrug intact bilaterally. XII - Tongue protrusion intact.  Motor Strength - Strength is 5/5 in all extremities following verbal commands and moving spontaneously  Motor Tone - Muscle tone was normal  Reflexes - The patient's reflexes were 1+ in all extremities and he had no pathological reflexes.  Sensory - Light touc, was assessed and was symmetrical.    Coordination - The patient had normal movements in the hands and feet with no ataxia or dysmetria.  Tremor was absent.  Gait and Station - Not assessed   ASSESSMENT/PLAN Mr. Peter Cole is a 66 y.o. male with history of systolic heart failure with severely reduced LVEF after MI in June 2018, large left mural thrombus, CAD s/p CABG presenting with dysarthria and expressive aphasia. He did not receive IV t-PA due to timing of presentation.   Stroke:  Left MCA infarct embolic with recent discontinuation of anticoagulation for previous mural thrombus after acute MI in June. Unclear if imaging showing resolution of clot was checked or if arrhythmias are present.  Resultant  Mild expressive aphasia  CT head New peripheral  low-attenuation areas within the posterior left parietal and posterior right parietal lobes consistent with nonhemorrhagic infarcts, acute or subacute  MRI head Acute ischemic infarct within the posterior left middle cerebral artery territory, predominantly involving the left parietal cortex.  MRA head No large vessel occlusion or other significant vascular abnormality  Carotid Doppler  Pending  2D Echo  LVEF 30-35%. Akinesis of the anterior wall, septum and apex; no LV thrombus noted using definity  LDL 80  HgbA1c 8.1%  Therapeutic heparin for VTE prophylaxis Diet heart healthy/carb modified Room service appropriate? Yes; Fluid consistency: Thin  aspirin 81 mg daily and clopidogrel 75 mg daily prior to admission, now on heparin IV  Patient counseled to be compliant with his antithrombotic medications  Ongoing aggressive stroke risk factor management  Therapy recommendations:  PT pending, outpatient SLP  Disposition:  Pending likely home  Hypertension  Stable  Permissive hypertension not indicated as strongly with subacute presentation  Long-term BP goal normotensive <130/90  Hyperlipidemia  Home meds:  none  LDL 80, goal < 70  Add atorvastatin 40mg   Continue statin at discharge  Diabetes  HgbA1c 8.1%, goal < 7.0  Uncontrolled  Other Stroke  Risk Factors  Advanced age  Coronary artery disease  Other Active Problems  Possible cardiac central embolic source. Unclear if he needs continued long term anticoagulation, likely to benefit given new embolic stroke although will try to clarify outpatient care plan from Dr. Verl Dicker clinic. Faxed copy of last office note was requested today by phone.  Hospital day # 1  I have personally examined this patient, reviewed notes, independently viewed imaging studies, participated in medical decision making and plan of care.ROS completed by me personally and pertinent positives fully documented  I have made any additions or  clarifications directly to the above note. He presented with expressive aphasia due to left MCA branch infarct etiology likely cardioembolic given recent history of left ventricular mural thrombus but unclear as to whether it has resolved since his anticoagulation was discontinued. Check echocardiogram and review cardiology notes.Continue aspirin and Plavix for now. Long discussion with the patient using Spanish language interpreter and answered questions..I spent  35 minutes in total face-to-face time with the patient, more than 50% of which was spent in counseling and coordination of care, reviewing test results, reviewing medication and discussing or reviewing the diagnosis of embolic stroke, LV mural thrombus   , the prognosis and treatment options.   Delia Heady, MD Medical Director [facility] Pager: [phone] 08/23/2017 5:30 PM   To contact Stroke Continuity provider, please refer to [email or web address]. After hours, contact General Neurology

## 2017-08-23 NOTE — Care Management Note (Signed)
Case Management Note  Patient Details  Name: Peter Cole MRN: [id number] Date of Birth: [birth year]  Subjective/Objective:    Pt admitted with CVA. He is from home with family. Pt speaks Spanish, son interpreting.                 Action/Plan: Awaiting PT/OT recommendations. CM following for d/c needs, physician orders.   Expected Discharge Date:                  Expected Discharge Plan:     In-House Referral:     Discharge planning Services     Post Acute Care Choice:    Choice offered to:     DME Arranged:    DME Agency:     HH Arranged:    HH Agency:     Status of Service:  In process, will continue to follow  If discussed at Long Length of Stay Meetings, dates discussed:    Additional Comments:  Kermit Balo, RN 08/23/2017, 11:24 AM

## 2017-08-23 NOTE — Evaluation (Signed)
Occupational Therapy Evaluation Patient Details Name: Peter Cole MRN: [id number] DOB: 1951-10-29 Today's Date: 08/23/2017    History of Present Illness 66 yo male presenting to ED with c/o 1-2 weeks of difficulty with word finding and speech. PMH including CAD s/p CABG and MI in June 2018. MRI showing acute ischemic infarct within the posterior left middle cerebral artery territory, predominantly involving the left parietal cortex.   Clinical Impression   PTA, pt was living with roommates and was independent with ADLs and IADLs. Pt currently functional at Mod I level with increased time. Pt reporting that he feels back to baseline. Educated pt on BEFAST stroke and symptoms; pt verbalizing understanding. Recommend dc once medically stable. All acute OT need met and will sign off.    Follow Up Recommendations  No OT follow up;Supervision/Assistance - 24 hour    Equipment Recommendations  None recommended by OT    Recommendations for Other Services       Precautions / Restrictions Precautions Precautions: Fall Restrictions Weight Bearing Restrictions: No      Mobility Bed Mobility Overal bed mobility: Needs Assistance Bed Mobility: Supine to Sit     Supine to sit: Supervision     General bed mobility comments: supervision for safety  Transfers Overall transfer level: Needs assistance Equipment used: None Transfers: Sit to/from Stand Sit to Stand: Supervision         General transfer comment: supervision for safety    Balance                                           ADL either performed or assessed with clinical judgement   ADL Overall ADL's : Modified independent                                       General ADL Comments: Increased time and near baseline funciton     Vision Baseline Vision/History: Wears glasses Wears Glasses: Reading only Patient Visual Report: No change from baseline;Other (comment) (Pt with  deficits in R eye causing blurriness at baseline)       Perception     Praxis      Pertinent Vitals/Pain Pain Assessment: No/denies pain     Hand Dominance Right   Extremity/Trunk Assessment Upper Extremity Assessment Upper Extremity Assessment: LUE deficits/detail LUE Deficits / Details: Slight decreased coordination of LUE and L hand   Lower Extremity Assessment Lower Extremity Assessment: Defer to PT evaluation   Cervical / Trunk Assessment Cervical / Trunk Assessment: Normal   Communication Communication Communication: Prefers language other than [place];Other (comment) (Used video interpreter)   Cognition Arousal/Alertness: Awake/alert Behavior During Therapy: WFL for tasks assessed/performed Overall Cognitive Status: Within Functional Limits for tasks assessed                                     General Comments  Used spanish video interpreter. Educated pt on BEFAST for stroke symptoms    Exercises     Shoulder Instructions      Home Living Family/patient expects to be discharged to:: Private residence Living Arrangements: Non-relatives/Friends (roommates) Available Help at Discharge: Friend(s);Available 24 hours/day Type of Home: Apartment (2nd floor) Home Access: Stairs to enter Entrance Stairs-Number of Steps:  flight         Bathroom Shower/Tub: Corporate investment banker: Standard                Prior Functioning/Environment Level of Independence: Independent        Comments: ADLs, IADLs, not driving        OT Problem List: Impaired balance (sitting and/or standing);Impaired vision/perception;Decreased knowledge of use of DME or AE      OT Treatment/Interventions:      OT Goals(Current goals can be found in the care plan section) Acute Rehab OT Goals Patient Stated Goal: Go home OT Goal Formulation: With patient Time For Goal Achievement: 09/06/17 Potential to Achieve Goals: Good  OT Frequency:      Barriers to D/C:            Co-evaluation              AM-PAC PT "6 Clicks" Daily Activity     Outcome Measure Help from another person eating meals?: None Help from another person taking care of personal grooming?: None Help from another person toileting, which includes using toliet, bedpan, or urinal?: None Help from another person bathing (including washing, rinsing, drying)?: None Help from another person to put on and taking off regular upper body clothing?: None Help from another person to put on and taking off regular lower body clothing?: None 6 Click Score: 24   End of Session Equipment Utilized During Treatment: Other (comment) (Spanish interpreter) Nurse Communication: Mobility status  Activity Tolerance: Patient tolerated treatment well Patient left: in bed;with call bell/phone within reach;with bed alarm set  OT Visit Diagnosis: Unsteadiness on feet (R26.81)                Time: 6010-9323 OT Time Calculation (min): 22 min Charges:  OT General Charges $OT Visit: 1 Visit OT Evaluation $OT Eval Moderate Complexity: 1 Mod G-Codes:     [place], OTR/L Acute Rehab Pager: [phone] Office: [place] 08/23/2017, 3:48 PM

## 2017-08-23 NOTE — Evaluation (Signed)
Speech Language Pathology Evaluation Patient Details Name: Peter Cole MRN: [id number] DOB: 1951/05/17 Today's Date: 08/23/2017 Time: 9179-1505 SLP Time Calculation (min) (ACUTE ONLY): 277 min  Problem List:  Patient Active Problem List   Diagnosis Date Noted  . Diabetic polyneuropathy associated with type 2 diabetes mellitus (HCC) 08/22/2017  . Other symptoms and signs involving the nervous system 08/22/2017  . Dysarthria 08/22/2017  . Acute embolic stroke (HCC) 08/22/2017  . Acute ischemic stroke (HCC) 08/22/2017  . Diabetes (HCC) 07/30/2017  . Ischemic cardiomyopathy 07/06/2017  . 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 Myocardial infarction (HCC) 05/12/2017  . Fungal infection of eye 05/12/2017  . History of myocardial infarction 05/12/2017  . Warfarin anticoagulation 05/12/2017  . LBBB (left bundle branch block) 05/12/2017   Past Medical History:  Past Medical History:  Diagnosis Date  . CAD (coronary artery disease)   . Diabetes mellitus without complication (HCC)   . Fungal infection of eye 05/12/2017  . Hyperlipidemia   . Ischemic cardiomyopathy 07/06/2017   06/16/2017. [facility].  Yates Decamp, MD.  Coronary artery bypass graft.  CKD stage 3 due to type 2 diabetes mellitus.  Hyperlipidemia, mixed.  . Ischemic cardiomyopathy   . LBBB (left bundle branch block) 05/12/2017  . Myocardial infarction (HCC)   . 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 Warfarin anticoagulation 05/12/2017   Past Surgical History:  Past Surgical History:  Procedure Laterality Date  . CORONARY ARTERY BYPASS GRAFT    . EYE SURGERY    . LEFT TOE AMPUTATION Left    HPI:  Peter Cole an 66 y.o.malewith a PMHx of low EF of 15-20% on echocardiogram, large left mural thrombus, CAD s/p CABG and MI who presents with dysarthria and expressive aphasia approximately one month after stopping Coumadin.  The patient stated that his doctor told him to stop taking it, but this is likely due to a misunderstanding of instructions. He has remained on ASA and Plavix.    Assessment / Plan / Recommendation Clinical Impression  Pt demonstrates a mild, rapidly improving aphasia with occasional hesitations at conversation level of speech. Subjectively pt feels less fluent than baseline but agrees that function is improving. Will follow for compensatory strategies while in acute care. Pt could consider outpatient therapy if symptoms persist.     SLP Assessment  SLP Recommendation/Assessment: Patient needs continued Speech Lanaguage Pathology Services SLP Visit Diagnosis: Aphasia (R47.01)    Follow Up Recommendations  Outpatient SLP    Frequency and Duration min 1 x/week  2 weeks      SLP Evaluation Cognition  Overall Cognitive Status: Within Functional Limits for tasks assessed       Comprehension  Auditory Comprehension Overall Auditory Comprehension: Appears within functional limits for tasks assessed    Expression Verbal Expression Overall Verbal Expression: Impaired Initiation: No impairment Automatic Speech: Name;Social Response Level of Generative/Spontaneous Verbalization: Conversation Repetition: No impairment Naming: Impairment Responsive: 76-100% accurate Confrontation: Within functional limits Convergent: Not tested Divergent: Not tested Verbal Errors: Aware of errors (hesitations) Pragmatics: No impairment   Oral / Motor  Oral Motor/Sensory Function Overall Oral Motor/Sensory Function: Within functional limits Motor Speech Overall Motor Speech: Appears within functional limits for tasks assessed   GO                   Harlon Ditty, MA CCC-SLP [phone]  Claudine Mouton 08/23/2017, 3:23 PM

## 2017-08-23 NOTE — Progress Notes (Signed)
PROGRESS NOTE    Peter Cole  TDS:[id number] DOB: 1951-05-19 DOA: 08/22/2017 PCP: Patient, No Pcp Per   Chief Complaint  Patient presents with  . Stroke Symptoms    Brief Narrative:  HPI 08/22/2017 by Dr. Lyda Perone Peter Cole is a 66 y.o. male with medical history significant of CAD s/p CABG.  MI in June 2018 in [place].  Echo at that time showed EF 15-20% and a left mural thrombus.  As a result he was started on Coumadin.  He also had heart cath at that time and is on ASA / Plavix as well as Coumadin (not entirely sure that stent was placed but son thinks so. Per Son: patient was reportedly told to stop taking coumadin ~1 month ago and has just been on ASA / plavix since then. Patient presents to the ED after being sent in by PCP today, with c/o 1-2 week history of difficulty with word finding and speech.  CT scan performed at [facility] today demonstrated findings concerning for acute / subacute strokes. Speaks spanish, son acts as Nurse, learning disability.  Assessment & Plan   Acute CVA -CT head shows new peripheral low-attenuation areas within the posterior left parietal and posterior right parietal lobes consistent with nonhemorrhagic infarcts, acute or subacute -MRI head showed acute ischemic infarct within the posterior left medial cerebral artery territory, predominantly involving the left parietal cortex. Nonacute right parietal lobe infarct with associated encephalomalacia and laminar necrosis. -MRA head: No large vessel occlusion or other significant vascular abnormality on MRA -Neurology consulted and appreciated -Echocardiogram shows an EF of 30-35%, grade 1 diastolic dysfunction -Carotid Doppler shows no significant ICA stenosis, antegrade vertebral flow -LDL 80 (Goal < 70) -Hemoglobin A1c 8.1 -Continue aspirin, Plavix -Will start statin -Pending PT, OT, speech  Ischemic cardiomyopathy/ chronic combined CHF -Echocardiogram showed EF of 30-35%, grade 1 diastolic dysfunction  (EF improved from 15-20% back in August 2018) -Patient follows with EP, Dr. Elberta Fortis -Currently has LifeVest in place however per outpatient note, from 08/15/2017, was supposed to be discontinued -Will attempt to contact EP to discuss -Currently appears to be euvolemic and compensated -Continue ramipril, spironolactone  Diabetes mellitus, type II -Continue Levemir, and some fine scale CBG monitoring -Hemoglobin A1c 8.1  Chronic kidney disease, stage III -Creatinine stable, continue to monitor BMP   DVT Prophylaxis  Heparin  Code Status: Full  Family Communication: None at bedside, Son via phone  Disposition Plan: Admitted, pending workup and further recommendations from neurology  Consultants Neurology  Procedures  Echocardiogram Carotid doppler  Antibiotics   Anti-infectives    None      Subjective:   Peter Cole seen and examined today.  Has no complaints today. Denies chest pain, shortness of breath, abdominal pain, N/V/D/C, dizziness, headache.     Objective:   Vitals:   08/23/17 0528 08/23/17 0839 08/23/17 1215 08/23/17 1411  BP: (!) 108/55 (!) 109/56 (!) 99/42 (!) 95/51  Pulse: (!) 57 65 61 77  Resp: 18 16 18 18   Temp: 98.8 F (37.1 C) 98.1 F (36.7 C) 98.4 F (36.9 C) 98 F (36.7 C)  TempSrc: Oral Oral Oral Oral  SpO2: 99% 96% 98% 97%    Intake/Output Summary (Last 24 hours) at 08/23/17 1448 Last data filed at 08/23/17 0300  Gross per 24 hour  Intake             52.5 ml  Output                0 ml  Net             52.5 ml   There were no vitals filed for this visit.  Exam  General: Well developed, well nourished, NAD, appears stated age  HEENT: NCAT, mucous membranes moist.   Cardiovascular: S1 S2 auscultated, RRR, no murmurs  Respiratory: Clear to auscultation bilaterally with equal chest rise  Abdomen: Soft, nontender, nondistended, + bowel sounds  Extremities: warm dry without cyanosis clubbing or edema  Neuro: AAOx3,  nonfocal  Psych: Appropriate and pleasant   Data Reviewed: I have personally reviewed following labs and imaging studies  CBC:  Recent Labs Lab 08/22/17 1131 08/22/17 1757 08/23/17 0400  WBC 5.9 6.8 7.3  NEUTROABS 3.2 3.5  --   HGB 11.8* 12.8* 11.3*  HCT 36.4* 37.2* 33.8*  MCV 89 85.3 85.1  PLT 366 352 358   Basic Metabolic Panel:  Recent Labs Lab 08/22/17 1131 08/22/17 1757  NA 138 137  K 4.5 4.2  CL 101 105  CO2 22 24  GLUCOSE 240* 108*  BUN 32* 29*  CREATININE 1.16 1.32*  CALCIUM 9.5 9.4   GFR: Estimated Creatinine Clearance: 50.7 mL/min (A) (by C-G formula based on SCr of 1.32 mg/dL (H)). Liver Function Tests:  Recent Labs Lab 08/22/17 1131 08/22/17 1757  AST 25 35  ALT 18 25  ALKPHOS 71 73  BILITOT 0.4 0.5  PROT 7.1 8.1  ALBUMIN 4.0 4.0   No results for input(s): LIPASE, AMYLASE in the last 168 hours. No results for input(s): AMMONIA in the last 168 hours. Coagulation Profile:  Recent Labs Lab 08/22/17 1757  INR 1.12   Cardiac Enzymes: No results for input(s): CKTOTAL, CKMB, CKMBINDEX, TROPONINI in the last 168 hours. BNP (last 3 results) No results for input(s): PROBNP in the last 8760 hours. HbA1C:  Recent Labs  08/22/17 1131 08/23/17 0400  HGBA1C 8.5* 8.1*   CBG:  Recent Labs Lab 08/23/17 0621 08/23/17 0737  GLUCAP 102* 99   Lipid Profile:  Recent Labs  08/23/17 0400  CHOL 135  HDL 30*  LDLCALC 80  TRIG 161126  CHOLHDL 4.5   Thyroid Function Tests: No results for input(s): TSH, T4TOTAL, FREET4, T3FREE, THYROIDAB in the last 72 hours. Anemia Panel: No results for input(s): VITAMINB12, FOLATE, FERRITIN, TIBC, IRON, RETICCTPCT in the last 72 hours. Urine analysis:    Component Value Date/Time   COLORURINE YELLOW 08/22/2017 1835   APPEARANCEUR HAZY (A) 08/22/2017 1835   LABSPEC 1.016 08/22/2017 1835   PHURINE 5.0 08/22/2017 1835   GLUCOSEU NEGATIVE 08/22/2017 1835   HGBUR NEGATIVE 08/22/2017 1835   BILIRUBINUR  NEGATIVE 08/22/2017 1835   KETONESUR NEGATIVE 08/22/2017 1835   PROTEINUR 100 (A) 08/22/2017 1835   NITRITE NEGATIVE 08/22/2017 1835   LEUKOCYTESUR NEGATIVE 08/22/2017 1835   Sepsis Labs: @LABRCNTIP (procalcitonin:4,lacticidven:4)  )No results found for this or any previous visit (from the past 240 hour(s)).    Radiology Studies: Ct Head Wo Contrast  Result Date: 08/22/2017 CLINICAL DATA:  Intermittent garbled speech, tingling in the legs, diabetes with neuropathy EXAM: CT HEAD WITHOUT CONTRAST TECHNIQUE: Contiguous axial images were obtained from the base of the skull through the vertex without intravenous contrast. COMPARISON:  CT brain of 01/22/2007 FINDINGS: Brain: There are new relatively well defined peripheral low-attenuation areas in the posterior left parietal infarct posterior right parietal lobes. No hemorrhage is seen in these areas are most consistent with edema secondary to nonhemorrhagic infarct. No mass effect is noted. The ventricular system is normal in size  and configuration and the septum is midline in position. The fourth ventricle and basilar cisterns are unremarkable. Vascular: No vascular abnormality is seen on this unenhanced study. Skull: On bone window images, no calvarial abnormality is noted. Sinuses/Orbits: The paranasal sinuses appear well pneumatized. Other: None. IMPRESSION: New peripheral low-attenuation areas within the posterior left parietal and posterior right parietal lobes consistent with nonhemorrhagic infarcts, acute or subacute. MRI may be helpful for further assessment. Electronically Signed   By: Dwyane Dee M.D.   On: 08/22/2017 13:47   Mr Brain Wo Contrast  Result Date: 08/22/2017 CLINICAL DATA:  Word-finding difficulties. EXAM: MRI HEAD WITHOUT CONTRAST MRA HEAD WITHOUT CONTRAST TECHNIQUE: Multiplanar, multiecho pulse sequences of the brain and surrounding structures were obtained without intravenous contrast. Angiographic images of the head were  obtained using MRA technique without contrast. COMPARISON:  None. FINDINGS: MRI HEAD FINDINGS Brain: The midline structures are normal. There is focal, predominantly cortical diffusion restriction within the left parietal lobe, within the posterior left MCA territory. Punctate foci of elevated signal on diffusion-weighted imaging in the posterior right parietal lobe may be artifactual. There is encephalomalacia in the right parietal lobe with subcortical gliosis and areas of hyperintense T1 weighted signal suggesting laminar necrosis. There is gyral swelling in the posterior left MCA territory. No acute hemorrhage. No chronic microhemorrhage or cerebral amyloid angiopathy. No hydrocephalus, age advanced atrophy or lobar predominant volume loss. No dural abnormality or extra-axial collection. Skull and upper cervical spine: The visualized skull base, calvarium, upper cervical spine and extracranial soft tissues are normal. Sinuses/Orbits: No fluid levels or advanced mucosal thickening. No mastoid effusion. Normal orbits. MRA HEAD FINDINGS Intracranial internal carotid arteries: Normal. Anterior cerebral arteries: Normal. Middle cerebral arteries: Normal. Posterior communicating arteries: Present on the left. Posterior cerebral arteries: Normal. Basilar artery: Normal. Vertebral arteries: Codominant. Normal. Superior cerebellar arteries: Normal. Anterior inferior cerebellar arteries: Normal left Posterior inferior cerebellar arteries: Normal right IMPRESSION: 1. Acute ischemic infarct within the posterior left middle cerebral artery territory, predominantly involving the left parietal cortex. 2. Nonacute right parietal lobe infarct with associated encephalomalacia and laminar necrosis. Areas of increased signal on diffusion-weighted imaging in this distribution are probably artifactual, possibly due to susceptibility effects from mineralization or old blood products at this site. 3. No large vessel occlusion or other  significant vascular abnormality on MRA. Electronically Signed   By: Deatra Robinson M.D.   On: 08/22/2017 21:58   Mr [name] Head Wo Contrast  Result Date: 08/22/2017 CLINICAL DATA:  Word-finding difficulties. EXAM: MRI HEAD WITHOUT CONTRAST MRA HEAD WITHOUT CONTRAST TECHNIQUE: Multiplanar, multiecho pulse sequences of the brain and surrounding structures were obtained without intravenous contrast. Angiographic images of the head were obtained using MRA technique without contrast. COMPARISON:  None. FINDINGS: MRI HEAD FINDINGS Brain: The midline structures are normal. There is focal, predominantly cortical diffusion restriction within the left parietal lobe, within the posterior left MCA territory. Punctate foci of elevated signal on diffusion-weighted imaging in the posterior right parietal lobe may be artifactual. There is encephalomalacia in the right parietal lobe with subcortical gliosis and areas of hyperintense T1 weighted signal suggesting laminar necrosis. There is gyral swelling in the posterior left MCA territory. No acute hemorrhage. No chronic microhemorrhage or cerebral amyloid angiopathy. No hydrocephalus, age advanced atrophy or lobar predominant volume loss. No dural abnormality or extra-axial collection. Skull and upper cervical spine: The visualized skull base, calvarium, upper cervical spine and extracranial soft tissues are normal. Sinuses/Orbits: No fluid levels or advanced mucosal thickening. No  mastoid effusion. Normal orbits. MRA HEAD FINDINGS Intracranial internal carotid arteries: Normal. Anterior cerebral arteries: Normal. Middle cerebral arteries: Normal. Posterior communicating arteries: Present on the left. Posterior cerebral arteries: Normal. Basilar artery: Normal. Vertebral arteries: Codominant. Normal. Superior cerebellar arteries: Normal. Anterior inferior cerebellar arteries: Normal left Posterior inferior cerebellar arteries: Normal right IMPRESSION: 1. Acute ischemic infarct  within the posterior left middle cerebral artery territory, predominantly involving the left parietal cortex. 2. Nonacute right parietal lobe infarct with associated encephalomalacia and laminar necrosis. Areas of increased signal on diffusion-weighted imaging in this distribution are probably artifactual, possibly due to susceptibility effects from mineralization or old blood products at this site. 3. No large vessel occlusion or other significant vascular abnormality on MRA. Electronically Signed   By: Deatra Robinson M.D.   On: 08/22/2017 21:58     Scheduled Meds: . aspirin EC  81 mg Oral Daily  . clopidogrel  75 mg Oral Daily  . dorzolamide  1 drop Left Eye BID  . insulin aspart  0-15 Units Subcutaneous TID WC  . insulin detemir  15 Units Subcutaneous Daily  . pantoprazole  40 mg Oral Daily  . prednisoLONE acetate  1 drop Left Eye TID  . ramipril  5 mg Oral Daily  . spironolactone  25 mg Oral Daily  . timolol  1 drop Both Eyes BID   Continuous Infusions: . heparin 1,100 Units/hr (08/23/17 0551)     LOS: 1 day   Time Spent in minutes   30 minutes  Elky Funches D.O. on 08/23/2017 at 2:48 PM  Between 7am to 7pm - Pager - [phone]  After 7pm go to [email or web address] - password [email or web address]  And look for the night coverage person covering for me after hours  [facility] Office  [phone]

## 2017-08-23 NOTE — Progress Notes (Addendum)
ANTICOAGULATION CONSULT NOTE   Pharmacy Consult for heparin dosing Indication:  CVA  No Known Allergies  Patient Measurements:   Heparin Dosing Weight: 71.6 kg  Vital Signs: Temp: 98 F (36.7 C) (10/17 1411) Temp Source: Oral (10/17 1411) BP: 95/51 (10/17 1411) Pulse Rate: 77 (10/17 1411)  Labs:  Recent Labs  08/22/17 1131 08/22/17 1757 08/23/17 0400 08/23/17 1420  HGB 11.8* 12.8* 11.3*  --   HCT 36.4* 37.2* 33.8*  --   PLT 366 352 358  --   APTT  --  31  --   --   LABPROT  --  14.3  --   --   INR  --  1.12  --   --   HEPARINUNFRC  --   --  0.26* 0.58  CREATININE 1.16 1.32*  --   --     Estimated Creatinine Clearance: 50.7 mL/min (A) (by C-G formula based on SCr of 1.32 mg/dL (H)).  Assessment: 65 y.o. 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 He is noted with recent MI (cath in 04/1017) on aspirin and plavix and recently stopped his coumadin   Goal of Therapy:  Heparin level 0.3-0.5 units/ml Monitor platelets by anticoagulation protocol: Yes   Plan:  Decrease heparin to 1050 units/hr Daily heparin level and CBC Will follow plans for oral anticoagulation Consider discontinuing aspirin when anticoagulation starts.  Harland German, Pharm D 08/23/2017 3:34 PM

## 2017-08-24 DIAGNOSIS — Z7901 Long term (current) use of anticoagulants: Secondary | ICD-10-CM

## 2017-08-24 LAB — GLUCOSE, CAPILLARY
GLUCOSE-CAPILLARY: 153 mg/dL — AB (ref 65–99)
GLUCOSE-CAPILLARY: 125 mg/dL — AB (ref 65–99)
Glucose-Capillary: 110 mg/dL — ABNORMAL HIGH (ref 65–99)
Glucose-Capillary: 128 mg/dL — ABNORMAL HIGH (ref 65–99)
Glucose-Capillary: 114 mg/dL — ABNORMAL HIGH (ref 65–99)
Glucose-Capillary: 105 mg/dL — ABNORMAL HIGH (ref 65–99)

## 2017-08-24 LAB — BASIC METABOLIC PANEL
Anion gap: 10 (ref 5–15)
BUN: 34 mg/dL — AB (ref 6–20)
CHLORIDE: 103 mmol/L (ref 101–111)
CO2: 23 mmol/L (ref 22–32)
CREATININE: 1.37 mg/dL — AB (ref 0.61–1.24)
Calcium: 8.7 mg/dL — ABNORMAL LOW (ref 8.9–10.3)
GFR calc Af Amer: >60 mL/min (ref >60)
GFR calc non Af Amer: 53 mL/min — ABNORMAL LOW (ref >60)
GLUCOSE: 111 mg/dL — AB (ref 65–99)
POTASSIUM: 4 mmol/L (ref 3.5–5.1)
SODIUM: 136 mmol/L (ref 135–145)

## 2017-08-24 LAB — CBC
HCT: 34.5 % — ABNORMAL LOW (ref 39.0–52.0)
HEMOGLOBIN: 11.4 g/dL — AB (ref 13.0–17.0)
MCH: 28.6 pg (ref 26.0–34.0)
MCHC: 33 g/dL (ref 30.0–36.0)
MCV: 86.5 fL (ref 78.0–100.0)
PLATELETS: 345 10*3/uL (ref 150–400)
RBC: 3.99 MIL/uL — ABNORMAL LOW (ref 4.22–5.81)
RDW: 14.6 % (ref 11.5–15.5)
WBC: 6.9 10*3/uL (ref 4.0–10.5)

## 2017-08-24 LAB — HEPARIN LEVEL (UNFRACTIONATED)
Heparin Unfractionated: 0.73 IU/mL — ABNORMAL HIGH (ref 0.30–0.70)
Heparin Unfractionated: 0.59 IU/mL (ref 0.30–0.70)

## 2017-08-24 NOTE — Progress Notes (Signed)
ANTICOAGULATION CONSULT NOTE   Pharmacy Consult for heparin dosing Indication:  CVA  No Known Allergies  Patient Measurements:   Heparin Dosing Weight: 71.6 kg  Vital Signs: Temp: 98.1 F (36.7 C) (10/18 0457) Temp Source: Oral (10/18 0457) BP: 100/53 (10/18 0457) Pulse Rate: 59 (10/18 0457)  Labs:  Recent Labs  08/22/17 1131  08/22/17 1757 08/23/17 0400 08/23/17 1420 08/24/17 0515  HGB 11.8*  < > 12.8* 11.3*  --  11.4*  HCT 36.4*  --  37.2* 33.8*  --  34.5*  PLT 366  --  352 358  --  345  APTT  --   --  31  --   --   --   LABPROT  --   --  14.3  --   --   --   INR  --   --  1.12  --   --   --   HEPARINUNFRC  --   --   --  0.26* 0.58 0.73*  CREATININE 1.16  --  1.32*  --   --  1.37*  < > = values in this interval not displayed.  Estimated Creatinine Clearance: 48.8 mL/min (A) (by C-G formula based on SCr of 1.37 mg/dL (H)).  Assessment: 66 y.o. 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 He is noted with recent MI (cath in 04/1017) on aspirin and plavix and recently stopped his coumadin. Plans noted for echo -heparin level is above goal  Goal of Therapy:  Heparin level 0.3-0.5 units/ml Monitor platelets by anticoagulation protocol: Yes   Plan:  Decrease heparin to 900 units/hr Heparin level in 6 hours and daily wth CBC daily  Harland German, Pharm D 08/24/2017 9:57 AM

## 2017-08-24 NOTE — Discharge Summary (Signed)
Physician Discharge Summary  Peter Cole:[id number] DOB: 1951-05-08 DOA: 08/22/2017  PCP: Patient, No Pcp Per  Admit date: 08/22/2017 Discharge date: 08/25/2017  Time spent: 45 minutes  Recommendations for Outpatient Follow-up:  Patient will be discharged to home.  Patient will need to follow up with primary care provider within one week of discharge.  Follow up with Dr. Pearlean Brownie, neurology, in 6 weeks. Patient should continue medications as prescribed.  Patient should follow a heart healthy/carb modifed diet.   Discharge Diagnoses:  Acute CVA Ischemic cardiomyopathy/ chronic combined CHF Diabetes mellitus, type II Chronic kidney disease, stage III  Discharge Condition: stable  Diet recommendation: Heart healthy/carb modified  There were no vitals filed for this visit.  History of present illness:  On 08/22/2017 by Dr. Alexander Mt Martinezis a 66 y.o.malewith medical history significant of CAD s/p CABG. MI in June 2018 in [place]. Echo at that time showed EF 15-20% and a left mural thrombus. As a result he was started on Coumadin. He also had heart cath at that time and is on ASA / Plavix as well as Coumadin (not entirely sure that stent was placed but son thinks so. Per Son: patient was reportedly told to stop taking coumadin ~1 month ago and has just been on ASA / plavix since then. Patient presents to the ED after being sent in by PCP today, with c/o 1-2 week history of difficulty with word finding and speech. CT scan performed at [facility] today demonstrated findings concerning for acute / subacute strokes. Speaks spanish, son acts as Nurse, learning disability.  Hospital Course:  Acute CVA -CT head shows new peripheral low-attenuation areas within the posterior left parietal and posterior right parietal lobes consistent with nonhemorrhagic infarcts, acute or subacute -MRI head showed acute ischemic infarct within the posterior left medial cerebral artery territory,  predominantly involving the left parietal cortex. Nonacute right parietal lobe infarct with associated encephalomalacia and laminar necrosis. -MRA head: No large vessel occlusion or other significant vascular abnormality on MRA -Neurology consulted and appreciated, recommended TEE.  Continue aspirin and Plavix. -Echocardiogram shows an EF of 30-35%, grade 1 diastolic dysfunction -Carotid Doppler shows no significant ICA stenosis, antegrade vertebral flow -LDL 80 (Goal < 70) -Hemoglobin A1c 8.1 -Continue aspirin, Plavix, statin -PT recommended outpatient PT; no further OT needs (patient refused physical therapy as his son works and would not be able to have transportation) -S/p TEE showed no cardiac source of cerebral emboli identified.  EF 30-35%.  No mural thrombus.  Ischemic cardiomyopathy/ chronic combined CHF -Echocardiogram showed EF of 30-35%, grade 1 diastolic dysfunction (EF improved from 15-20% back in August 2018) -Patient follows with EP, Dr. Elberta Fortis -Currently has LifeVest in place however per outpatient note, from 08/15/2017, was supposed to be discontinued -Currently appears to be euvolemic and compensated -Continue ramipril, spironolactone  Diabetes mellitus, type II -Continue Levemir, and insulin sliding scale CBG monitoring -Hemoglobin A1c 8.1  Chronic kidney disease, stage III -Creatinine stable, continue to monitor BMP   History of mural thrombus -Discussed with Dr. Jacinto Halim, cardiologist, via phone. Patient was on coumadin for appropriate amount of time. While he was taking it, his INR was normally supratherapeutic. Patient does have some noncompliance with medications as well  Consultants Neurology Cardiology   Procedures  Echocardiogram Carotid doppler TEE  Discharge Exam: Vitals:   08/25/17 1613 08/25/17 1714  BP: (!) 98/38 (!) 109/58  Pulse:  (!) 52  Resp: 16 17  Temp:  98.1 F (36.7 C)  SpO2: 96% 97%  Patient has no complaints today. Denies  chest pain, shortness of breath, abdominal pain, N/V/D/C.    General: Well developed, well nourished, NAD, appears stated age  HEENT: NCAT, mucous membranes moist.  Cardiovascular: S1 S2 auscultated, no rubs, murmurs or gallops. Regular rate and rhythm. Lifevest on  Respiratory: Clear to auscultation bilaterally with equal chest rise  Abdomen: Soft, nontender, nondistended, + bowel sounds  Extremities: warm dry without cyanosis clubbing or edema  Neuro: AAOx3, nonfocal  Psych: Appropriate  Discharge Instructions  Current Discharge Medication List    CONTINUE these medications which have NOT CHANGED   Details  aspirin EC 81 MG tablet Take 81 mg by mouth daily.    clopidogrel (PLAVIX) 75 MG tablet Take 75 mg by mouth daily.    dorzolamide (TRUSOPT) 2 % ophthalmic solution Place 1 drop into the left eye 2 (two) times daily.    Insulin Detemir (LEVEMIR FLEXPEN) 100 UNIT/ML Pen Inject 30 Units into the skin every morning. And pen needles 1/day Qty: 15 mL, Refills: 11    pantoprazole (PROTONIX) 40 MG tablet Take 40 mg by mouth daily.    prednisoLONE acetate (PRED FORTE) 1 % ophthalmic suspension Place 1 drop into the left eye 3 (three) times daily.    ramipril (ALTACE) 5 MG capsule Take 1 capsule (5 mg total) by mouth daily. Qty: 90 capsule, Refills: 1    spironolactone (ALDACTONE) 25 MG tablet Take 1 tablet (25 mg total) by mouth daily. Qty: 90 tablet, Refills: 1    timolol (BETIMOL) 0.5 % ophthalmic solution Place 1 drop into both eyes 2 (two) times daily.    polyvinyl alcohol (LIQUIFILM TEARS) 1.4 % ophthalmic solution Place 1 drop into both eyes as needed for dry eyes.       No Known Allergies Follow-up Information    Primary care physician. Schedule an appointment as soon as possible for a visit in 1 week(s).   Why:  Hospital follow up       Micki Riley, MD. Schedule an appointment as soon as possible for a visit in 6 week(s).   Specialties:  Neurology,  Radiology Why:  Hospital follow up, stroke clinic Contact information: [address] [place] [address] [phone]            The results of significant diagnostics from this hospitalization (including imaging, microbiology, ancillary and laboratory) are listed below for reference.    Significant Diagnostic Studies: Ct Head Wo Contrast  Result Date: 08/22/2017 CLINICAL DATA:  Intermittent garbled speech, tingling in the legs, diabetes with neuropathy EXAM: CT HEAD WITHOUT CONTRAST TECHNIQUE: Contiguous axial images were obtained from the base of the skull through the vertex without intravenous contrast. COMPARISON:  CT brain of 01/22/2007 FINDINGS: Brain: There are new relatively well defined peripheral low-attenuation areas in the posterior left parietal infarct posterior right parietal lobes. No hemorrhage is seen in these areas are most consistent with edema secondary to nonhemorrhagic infarct. No mass effect is noted. The ventricular system is normal in size and configuration and the septum is midline in position. The fourth ventricle and basilar cisterns are unremarkable. Vascular: No vascular abnormality is seen on this unenhanced study. Skull: On bone window images, no calvarial abnormality is noted. Sinuses/Orbits: The paranasal sinuses appear well pneumatized. Other: None. IMPRESSION: New peripheral low-attenuation areas within the posterior left parietal and posterior right parietal lobes consistent with nonhemorrhagic infarcts, acute or subacute. MRI may be helpful for further assessment. Electronically Signed   By: Renae Fickle  Gery PrayBarry M.D.   On: 08/22/2017 13:47   Mr Brain Wo Contrast  Result Date: 08/22/2017 CLINICAL DATA:  Word-finding difficulties. EXAM: MRI HEAD WITHOUT CONTRAST MRA HEAD WITHOUT CONTRAST TECHNIQUE: Multiplanar, multiecho pulse sequences of the brain and surrounding structures were obtained without intravenous contrast. Angiographic images of the head  were obtained using MRA technique without contrast. COMPARISON:  None. FINDINGS: MRI HEAD FINDINGS Brain: The midline structures are normal. There is focal, predominantly cortical diffusion restriction within the left parietal lobe, within the posterior left MCA territory. Punctate foci of elevated signal on diffusion-weighted imaging in the posterior right parietal lobe may be artifactual. There is encephalomalacia in the right parietal lobe with subcortical gliosis and areas of hyperintense T1 weighted signal suggesting laminar necrosis. There is gyral swelling in the posterior left MCA territory. No acute hemorrhage. No chronic microhemorrhage or cerebral amyloid angiopathy. No hydrocephalus, age advanced atrophy or lobar predominant volume loss. No dural abnormality or extra-axial collection. Skull and upper cervical spine: The visualized skull base, calvarium, upper cervical spine and extracranial soft tissues are normal. Sinuses/Orbits: No fluid levels or advanced mucosal thickening. No mastoid effusion. Normal orbits. MRA HEAD FINDINGS Intracranial internal carotid arteries: Normal. Anterior cerebral arteries: Normal. Middle cerebral arteries: Normal. Posterior communicating arteries: Present on the left. Posterior cerebral arteries: Normal. Basilar artery: Normal. Vertebral arteries: Codominant. Normal. Superior cerebellar arteries: Normal. Anterior inferior cerebellar arteries: Normal left Posterior inferior cerebellar arteries: Normal right IMPRESSION: 1. Acute ischemic infarct within the posterior left middle cerebral artery territory, predominantly involving the left parietal cortex. 2. Nonacute right parietal lobe infarct with associated encephalomalacia and laminar necrosis. Areas of increased signal on diffusion-weighted imaging in this distribution are probably artifactual, possibly due to susceptibility effects from mineralization or old blood products at this site. 3. No large vessel occlusion or  other significant vascular abnormality on MRA. Electronically Signed   By: Deatra RobinsonKevin  Herman M.D.   On: 08/22/2017 21:58   Mr Maxine GlennMra Head Wo Contrast  Result Date: 08/22/2017 CLINICAL DATA:  Word-finding difficulties. EXAM: MRI HEAD WITHOUT CONTRAST MRA HEAD WITHOUT CONTRAST TECHNIQUE: Multiplanar, multiecho pulse sequences of the brain and surrounding structures were obtained without intravenous contrast. Angiographic images of the head were obtained using MRA technique without contrast. COMPARISON:  None. FINDINGS: MRI HEAD FINDINGS Brain: The midline structures are normal. There is focal, predominantly cortical diffusion restriction within the left parietal lobe, within the posterior left MCA territory. Punctate foci of elevated signal on diffusion-weighted imaging in the posterior right parietal lobe may be artifactual. There is encephalomalacia in the right parietal lobe with subcortical gliosis and areas of hyperintense T1 weighted signal suggesting laminar necrosis. There is gyral swelling in the posterior left MCA territory. No acute hemorrhage. No chronic microhemorrhage or cerebral amyloid angiopathy. No hydrocephalus, age advanced atrophy or lobar predominant volume loss. No dural abnormality or extra-axial collection. Skull and upper cervical spine: The visualized skull base, calvarium, upper cervical spine and extracranial soft tissues are normal. Sinuses/Orbits: No fluid levels or advanced mucosal thickening. No mastoid effusion. Normal orbits. MRA HEAD FINDINGS Intracranial internal carotid arteries: Normal. Anterior cerebral arteries: Normal. Middle cerebral arteries: Normal. Posterior communicating arteries: Present on the left. Posterior cerebral arteries: Normal. Basilar artery: Normal. Vertebral arteries: Codominant. Normal. Superior cerebellar arteries: Normal. Anterior inferior cerebellar arteries: Normal left Posterior inferior cerebellar arteries: Normal right IMPRESSION: 1. Acute ischemic  infarct within the posterior left middle cerebral artery territory, predominantly involving the left parietal cortex. 2. Nonacute right parietal lobe infarct with associated  encephalomalacia and laminar necrosis. Areas of increased signal on diffusion-weighted imaging in this distribution are probably artifactual, possibly due to susceptibility effects from mineralization or old blood products at this site. 3. No large vessel occlusion or other significant vascular abnormality on MRA. Electronically Signed   By: Deatra Robinson M.D.   On: 08/22/2017 21:58    Microbiology: No results found for this or any previous visit (from the past 240 hour(s)).   Labs: Basic Metabolic Panel:  Recent Labs Lab 08/22/17 1131 08/22/17 1757 08/24/17 0515  NA 138 137 136  K 4.5 4.2 4.0  CL 101 105 103  CO2 22 24 23   GLUCOSE 240* 108* 111*  BUN 32* 29* 34*  CREATININE 1.16 1.32* 1.37*  CALCIUM 9.5 9.4 8.7*   Liver Function Tests:  Recent Labs Lab 08/22/17 1131 08/22/17 1757  AST 25 35  ALT 18 25  ALKPHOS 71 73  BILITOT 0.4 0.5  PROT 7.1 8.1  ALBUMIN 4.0 4.0   No results for input(s): LIPASE, AMYLASE in the last 168 hours. No results for input(s): AMMONIA in the last 168 hours. CBC:  Recent Labs Lab 08/22/17 1131 08/22/17 1757 08/23/17 0400 08/24/17 0515 08/25/17 0509  WBC 5.9 6.8 7.3 6.9 7.0  NEUTROABS 3.2 3.5  --   --   --   HGB 11.8* 12.8* 11.3* 11.4* 11.1*  HCT 36.4* 37.2* 33.8* 34.5* 33.3*  MCV 89 85.3 85.1 86.5 85.4  PLT 366 352 358 345 342   Cardiac Enzymes: No results for input(s): CKTOTAL, CKMB, CKMBINDEX, TROPONINI in the last 168 hours. BNP: BNP (last 3 results) No results for input(s): BNP in the last 8760 hours.  ProBNP (last 3 results) No results for input(s): PROBNP in the last 8760 hours.  CBG:  Recent Labs Lab 08/24/17 1617 08/24/17 2134 08/25/17 0619 08/25/17 1130 08/25/17 1632  GLUCAP 125* 105* 99 77 72       Signed:  Kegan Mckeithan  [facility] 08/25/2017, 5:24 PM'

## 2017-08-24 NOTE — Progress Notes (Signed)
STROKE TEAM PROGRESS NOTE   HISTORY OF PRESENT ILLNESS (per record) Peter Cole is an 66 y.o. male with a PMHx of low EF of 15-20% on echocardiogram, large left mural thrombus, CAD s/p CABG and MI who presents with dysarthria and expressive aphasia approximately one month after stopping Coumadin. The patient stated that his doctor told him to stop taking it, but this is likely due to a misunderstanding of instructions. He has remained on ASA and Plavix.   He presented to the ED on Tuesday from his PCPs office for stroke evaluation. He has had a 1 week history of difficulty with speech, including word-finding problems and dysarthria. He states in Spanish to the interpreter that he has had trouble getting the words out, but can understand what is being said by others. [facility] which revealed findings consistent with subacute strokes.   PMHx also includes DM, HLD, ischemic cardiomyopathy, and CKD3.   Patient was not administered IV t-PA secondary to timing of presentation.   SUBJECTIVE (INTERVAL HISTORY)  Patient was examined with Spanish language interpreter at the bedside. He states subjectively he is still having trouble speaking but apparently was speaking quite fluent in [place]. He has no other complaints. Transthoracic echo shows diminished ejection fraction but no definite clot is noted. I discussed this case with Dr.  Jacinto HalimGanji who agreed to perform TEE tomorrow to look for clot. Patient apparently was noncompliant with his warfarin given prior to being discontinued   OBJECTIVE CBC:   Recent Labs Lab 08/22/17 1131  08/22/17 1757 08/23/17 0400 08/24/17 0515  WBC 5.9  < > 6.8 7.3 6.9  NEUTROABS 3.2  --  3.5  --   --   HGB 11.8*  < > 12.8* 11.3* 11.4*  HCT 36.4*  < > 37.2* 33.8* 34.5*  MCV 89  < > 85.3 85.1 86.5  PLT 366  < > 352 358 345  < > = values in this interval not displayed.  Basic Metabolic Panel:   Recent Labs Lab 08/22/17 1757  08/24/17 0515  NA 137 136  K 4.2 4.0  CL 105 103  CO2 24 23  GLUCOSE 108* 111*  BUN 29* 34*  CREATININE 1.32* 1.37*  CALCIUM 9.4 8.7*    Lipid Panel:     Component Value Date/Time   CHOL 135 08/23/2017 0400   CHOL 147 08/07/2017 1039   TRIG 126 08/23/2017 0400   HDL 30 (L) 08/23/2017 0400   HDL 33 (L) 08/07/2017 1039   CHOLHDL 4.5 08/23/2017 0400   VLDL 25 08/23/2017 0400   LDLCALC 80 08/23/2017 0400   LDLCALC 70 08/07/2017 1039   HgbA1c:  Lab Results  Component Value Date   HGBA1C 8.1 (H) 08/23/2017   Urine Drug Screen:     Component Value Date/Time   LABOPIA NONE DETECTED 08/22/2017 1835   COCAINSCRNUR NONE DETECTED 08/22/2017 1835   LABBENZ NONE DETECTED 08/22/2017 1835   AMPHETMU NONE DETECTED 08/22/2017 1835   THCU NONE DETECTED 08/22/2017 1835   LABBARB NONE DETECTED 08/22/2017 1835    Alcohol Level     Component Value Date/Time   ETH <10 08/22/2017 1757    IMAGING  Ct Head Wo Contrast 08/22/2017 New peripheral low-attenuation areas within the posterior left parietal and posterior right parietal lobes consistent with nonhemorrhagic infarcts, acute or subacute. MRI may be helpful for further assessment.  Mr Brain 93Wo Contrast Mr Maxine GlennMra Head Wo Contrast 08/22/2017 1. Acute ischemic infarct within the  posterior left middle cerebral artery territory, predominantly involving the left parietal cortex. 2. Nonacute right parietal lobe infarct with associated encephalomalacia and laminar necrosis. Areas of increased signal on diffusion-weighted imaging in this distribution are probably artifactual, possibly due to susceptibility effects from mineralization or old blood products at this site. 3. No large vessel occlusion or other significant vascular abnormality on MRA.  2D Echocardiogram 08/23/2017 LVEF 30-35%. Akinesis of the anterior wall, septum and apex; overall moderate to severe LV dysfunction; mild diastolic dysfunction; mild LVE; no LV thrombus noted  using definity; mild LAE; trace MR and TR.  Carotid Doppler US 08/23/2017 Pending  PHYSICAL EXAM Temp:  [98 F (36.7 C)-98.9 F (37.2 C)] 98.2 F (36.8 C) (10/18 0959) Pulse Rate:  [52-78] 52 (10/18 0959) Resp:  [18-20] 20 (10/18 0959) BP: (93-114)/(47-58) 107/50 (10/18 0959) SpO2:  [97 %-100 %] 100 % (10/18 0959)  General - Well nourished, well developed, in no apparent distress.  Mental Status -  Language including expression, naming, repetition, comprehension was assessed and found intact. But has some word hesitancy and dysfluency Attention span and concentration were normal. Recent and remote memory were intact.  Cranial Nerves II - XII - II - Visual field intact OU. III, IV, VI - Extraocular movements intact. V - Facial sensation intact bilaterally. VII - Facial movement intact bilaterally. VIII - Hearing & vestibular intact bilaterally. X - Palate elevates symmetrically. XI - Chin turning & shoulder shrug intact bilaterally. XII - Tongue protrusion intact.  Motor Strength - Strength is 5/5 in all extremities following verbal commands and moving spontaneously  Motor Tone - Muscle tone was normal  Reflexes - The patient's reflexes were 1+ in all extremities and he had no pathological reflexes.  Sensory - Light touc, was assessed and was symmetrical.    Coordination - The patient had normal movements in the hands and feet with no ataxia or dysmetria.  Tremor was absent.  Gait and Station - Not assessed   ASSESSMENT/PLAN Mr. Peter Cole is a 66 y.o. male with history of systolic heart failure with severely reduced LVEF after MI in June 2018, large left mural thrombus, CAD s/p CABG presenting with dysarthria and expressive aphasia. He did not receive IV t-PA due to timing of presentation.   Stroke:  Left MCA infarct embolic with recent discontinuation of anticoagulation for previous mural thrombus after acute MI in June. Unclear if imaging showing resolution of  clot was checked or if arrhythmias are present.  Resultant  Mild expressive aphasia  CT head New peripheral low-attenuation areas within the posterior left parietal and posterior right parietal lobes consistent with nonhemorrhagic infarcts, acute or subacute  MRI head Acute ischemic infarct within the posterior left middle cerebral artery territory, predominantly involving the left parietal cortex.  MRA head No large vessel occlusion or other significant vascular abnormality  Carotid Doppler  Pending  2D Echo  LVEF 30-35%. Akinesis of the anterior wall, septum and apex; no LV thrombus noted using definity  LDL 80  HgbA1c 8.1%  Therapeutic heparin for VTE prophylaxis Diet heart healthy/carb modified Room service appropriate? Yes; Fluid consistency: Thin  aspirin 81 mg daily and clopidogrel 75 mg daily prior to admission, now on heparin IV  Patient counseled to be compliant with his antithrombotic medications  Ongoing aggressive stroke risk factor management  Therapy recommendations:  PT pending, outpatient SLP  Disposition:  Pending likely home  Hypertension  Stable  Permissive hypertension not indicated as strongly with subacute presentation  Long-term BP  goal normotensive <130/90  Hyperlipidemia  Home meds:  none  LDL 80, goal < 70  Add atorvastatin 40mg   Continue statin at discharge  Diabetes  HgbA1c 8.1%, goal < 7.0  Uncontrolled  Other Stroke Risk Factors  Advanced age  Coronary artery disease  Other Active Problems  Possible cardiac central embolic source. Unclear if he needs continued long term anticoagulation, likely to benefit given new embolic stroke although will try to get TEE first and make final determination after that.continue IV heparin for now. If no clot noted on TEE discharge on aspirin and Plavix.due to his history of noncompliance he may not be the best candidate for loop recorder insertion and long-term anticoagulation even if A. Fib  is found  Hospital day # 2  I have personally examined this patient, reviewed notes, independently viewed imaging studies, participated in medical decision making and plan of care.ROS completed by me personally and pertinent positives fully documented  I have made any additions or clarifications directly to the above note. He presented with expressive aphasia due to left MCA branch infarct etiology likely cardioembolic given recent history of left ventricular mural thrombus but unclear as to whether it has resolved since his anticoagulation was discontinued. Check echocardiogram and review cardiology notes.Continue aspirin and Plavix for now. Long discussion with the patient using Spanish language interpreter and answered questions..I spent  25 minutes in total face-to-face time with the patient, more than 50% of which was spent in counseling and coordination of care, reviewing test results, reviewing medication and discussing or reviewing the diagnosis of embolic stroke, LV mural thrombus   , the prognosis and treatment options.discussed with Dr. Jetty Peeks, MD Medical Director [facility] Pager: [phone] 08/24/2017 1:49 PM   To contact Stroke Continuity provider, please refer to [email or web address]. After hours, contact General Neurology

## 2017-08-24 NOTE — Progress Notes (Signed)
  Speech Language Pathology Treatment: Cognitive-Linquistic  Patient Details Name: Peter Cole MRN: [id number] DOB: 1951-05-06 Today's Date: 08/24/2017 Time: 0511-0211 SLP Time Calculation (min) (ACUTE ONLY): 49 min  Assessment / Plan / Recommendation Clinical Impression  Pt demonstrates mild word finding deficits with hesitations and phonemic paraphasias. SLP provided education, moderate written and verbal cues and compensatory strategies when reciting prayers, naming pictures, finding words in conversation. Pt will benefit from f/u with Outpatient SLP therapy.   HPI HPI: Peter Cole an 66 y.o.malewith a PMHx of low EF of 15-20% on echocardiogram, large left mural thrombus, CAD s/p CABG and MI who presents with dysarthria and expressive aphasia approximately one month after stopping Coumadin. The patient stated that his doctor told him to stop taking it, but this is likely due to a misunderstanding of instructions. He has remained on ASA and Plavix.       SLP Plan  Continue with current plan of care       Recommendations                   Follow up Recommendations: Outpatient SLP SLP Visit Diagnosis: Aphasia (R47.01) Plan: Continue with current plan of care       GO               [facility], [place] CCC-SLP [phone]  Claudine Mouton 08/24/2017, 1:51 PM

## 2017-08-24 NOTE — Progress Notes (Signed)
ANTICOAGULATION CONSULT NOTE   Pharmacy Consult for heparin dosing Indication:  CVA  No Known Allergies  Patient Measurements:   Heparin Dosing Weight: 71.6 kg  Vital Signs: Temp: 98.2 F (36.8 C) (10/18 0959) Temp Source: Oral (10/18 0959) BP: 107/50 (10/18 0959) Pulse Rate: 52 (10/18 0959)  Labs:  Recent Labs  08/22/17 1131  08/22/17 1757  08/23/17 0400 08/23/17 1420 08/24/17 0515 08/24/17 1603  HGB 11.8*  < > 12.8*  --  11.3*  --  11.4*  --   HCT 36.4*  --  37.2*  --  33.8*  --  34.5*  --   PLT 366  --  352  --  358  --  345  --   APTT  --   --  31  --   --   --   --   --   LABPROT  --   --  14.3  --   --   --   --   --   INR  --   --  1.12  --   --   --   --   --   HEPARINUNFRC  --   --   --   < > 0.26* 0.58 0.73* 0.59  CREATININE 1.16  --  1.32*  --   --   --  1.37*  --   < > = values in this interval not displayed.  Estimated Creatinine Clearance: 48.8 mL/min (A) (by C-G formula based on SCr of 1.37 mg/dL (H)).  Assessment: 66 y.o. 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 He is noted with recent MI (cath in 04/1017) on aspirin and plavix and recently stopped his coumadin. Plans noted for echo -heparin level is above goal  Goal of Therapy:  Heparin level 0.3-0.5 units/ml Monitor platelets by anticoagulation protocol: Yes   Plan:  Decrease heparin to 850 units/hr Next level in AM  Thank you Okey Regal, PharmD [phone]  08/24/2017 4:57 PM

## 2017-08-24 NOTE — Progress Notes (Signed)
Physical Therapy Treatment Patient Details Name: Peter Cole MRN: [id number] DOB: 02/26/1951 Today's Date: 08/24/2017    History of Present Illness 66 yo male presenting to ED with c/o 1-2 weeks of difficulty with word finding and speech. PMH including CAD s/p CABG and MI in June 2018. MRI showing acute ischemic infarct within the posterior left middle cerebral artery territory, predominantly involving the left parietal cortex.    PT Comments    Pt seen for further mobility assessment and pt agreeable to ambulate and participate in higher level balance assessment; see below for details. Pt with one LOB during ambulation but was able to self-correct. Pt would continue to benefit from skilled physical therapy services at this time while admitted and after d/c to address the below listed limitations in order to improve overall safety and independence with functional mobility.    Follow Up Recommendations  Outpatient PT     Equipment Recommendations  None recommended by PT    Recommendations for Other Services       Precautions / Restrictions Precautions Precautions: Fall Restrictions Weight Bearing Restrictions: No    Mobility  Bed Mobility Overal bed mobility: Independent                Transfers Overall transfer level: Modified independent Equipment used: None                Ambulation/Gait Ambulation/Gait assistance: Min guard Ambulation Distance (Feet): 300 Feet Assistive device: None (pushing IV pole) Gait Pattern/deviations: Step-through pattern;Decreased stride length Gait velocity: decreased Gait velocity interpretation: Below normal speed for age/gender General Gait Details: pt with one LOB laterally (to L) upon initially releasing UE support on IV pole but able to self-correct.   Stairs            Wheelchair Mobility    Modified Rankin (Stroke Patients Only) Modified Rankin (Stroke Patients Only) Pre-Morbid Rankin Score: No  symptoms Modified Rankin: No symptoms     Balance Overall balance assessment: Needs assistance Sitting-balance support: No upper extremity supported Sitting balance-Leahy Scale: Normal     Standing balance support: During functional activity;No upper extremity supported Standing balance-Leahy Scale: Fair               High level balance activites: Backward walking;Direction changes;Turns;Head turns High Level Balance Comments: modest instability with higher level balance tasks but no overt LOB or need for physical assistance            Cognition Arousal/Alertness: Awake/alert Behavior During Therapy: WFL for tasks assessed/performed Overall Cognitive Status: Within Functional Limits for tasks assessed                                        Exercises      General Comments General comments (skin integrity, edema, etc.): utilized video Spanish interpreter throughout      Pertinent Vitals/Pain Pain Assessment: No/denies pain    Home Living                      Prior Function            PT Goals (current goals can now be found in the care plan section) Acute Rehab PT Goals PT Goal Formulation: With patient Time For Goal Achievement: 09/06/17 Potential to Achieve Goals: Good Progress towards PT goals: Progressing toward goals    Frequency    Min 3X/week  PT Plan Discharge plan needs to be updated    Co-evaluation              AM-PAC PT "6 Clicks" Daily Activity  Outcome Measure  Difficulty turning over in bed (including adjusting bedclothes, sheets and blankets)?: None Difficulty moving from lying on back to sitting on the side of the bed? : None Difficulty sitting down on and standing up from a chair with arms (e.g., wheelchair, bedside commode, etc,.)?: None Help needed moving to and from a bed to chair (including a wheelchair)?: None Help needed walking in hospital room?: A Little Help needed climbing 3-5  steps with a railing? : A Little 6 Click Score: 22    End of Session Equipment Utilized During Treatment: Gait belt Activity Tolerance: Patient tolerated treatment well Patient left: in bed;with call bell/phone within reach Nurse Communication: Mobility status PT Visit Diagnosis: Other symptoms and signs involving the nervous system (R29.898)     Time: 4163-8453 PT Time Calculation (min) (ACUTE ONLY): 19 min  Charges:  $Gait Training: 8-22 mins                    G Codes:       [place], [place], [place] [phone]    [name] Jera Headings 08/24/2017, 3:56 PM

## 2017-08-24 NOTE — Progress Notes (Addendum)
PROGRESS NOTE    Peter Cole  ZOX:[id number] DOB: 05/04/1951 DOA: 08/22/2017 PCP: Patient, No Pcp Per   Chief Complaint  Patient presents with  . Stroke Symptoms    Brief Narrative:  HPI 08/22/2017 by Dr. Lyda Perone Peter Cole is a 66 y.o. male with medical history significant of CAD s/p CABG.  MI in June 2018 in [place].  Echo at that time showed EF 15-20% and a left mural thrombus.  As a result he was started on Coumadin.  He also had heart cath at that time and is on ASA / Plavix as well as Coumadin (not entirely sure that stent was placed but son thinks so. Per Son: patient was reportedly told to stop taking coumadin ~1 month ago and has just been on ASA / plavix since then. Patient presents to the ED after being sent in by PCP today, with c/o 1-2 week history of difficulty with word finding and speech.  CT scan performed at [facility] today demonstrated findings concerning for acute / subacute strokes. Speaks spanish, son acts as Nurse, learning disability.  Assessment & Plan   Acute CVA -CT head shows new peripheral low-attenuation areas within the posterior left parietal and posterior right parietal lobes consistent with nonhemorrhagic infarcts, acute or subacute -MRI head showed acute ischemic infarct within the posterior left medial cerebral artery territory, predominantly involving the left parietal cortex. Nonacute right parietal lobe infarct with associated encephalomalacia and laminar necrosis. -MRA head: No large vessel occlusion or other significant vascular abnormality on MRA -Neurology consulted and appreciated -Echocardiogram shows an EF of 30-35%, grade 1 diastolic dysfunction -Carotid Doppler shows no significant ICA stenosis, antegrade vertebral flow -LDL 80 (Goal < 70) -Hemoglobin A1c 8.1 -Continue aspirin, Plavix, statin -Pending PT, OT, speech -pending further recommendations from neurology- discussed with Dr. Pearlean Brownie, would like patient to get a TEE -Cardiology, Dr.  Jacinto Halim, consulted and appreciated  Ischemic cardiomyopathy/ chronic combined CHF -Echocardiogram showed EF of 30-35%, grade 1 diastolic dysfunction (EF improved from 15-20% back in August 2018) -Patient follows with EP, Dr. Elberta Fortis -Currently has LifeVest in place however per outpatient note, from 08/15/2017, was supposed to be discontinued -Will attempt to contact EP to discuss -Currently appears to be euvolemic and compensated -Continue ramipril, spironolactone  Diabetes mellitus, type II -Continue Levemir, and some fine scale CBG monitoring -Hemoglobin A1c 8.1  Chronic kidney disease, stage III -Creatinine stable, continue to monitor BMP   History of mural thrombus -Discussed with Dr. Jacinto Halim, cardiologist, via phone. Patient was on coumadin for appropriate amount of time. While he was taking it, his INR was normally supratherapeutic. Patient does have some noncompliance with medications as well  DVT Prophylaxis  Heparin  Code Status: Full  Family Communication: None at bedside  Disposition Plan: Admitted, pending workup and further recommendations from neurology  Consultants Neurology Cardiology   Procedures  Echocardiogram Carotid doppler  Antibiotics   Anti-infectives    None      Subjective:   Peter Cole seen and examined today.  No complaints today. Currently denies chest pain, shortness of breath, abdominal pain, N/V/D/C, dizziness, headache.     Objective:   Vitals:   08/23/17 2110 08/24/17 0107 08/24/17 0457 08/24/17 0959  BP: (!) 93/47 (!) 114/56 (!) 100/53 (!) 107/50  Pulse: (!) 57 (!) 52 (!) 59 (!) 52  Resp: 18 18 18 20   Temp: 98.6 F (37 C) 98.8 F (37.1 C) 98.1 F (36.7 C) 98.2 F (36.8 C)  TempSrc: Oral Oral Oral Oral  SpO2:  98% 99% 100% 100%   No intake or output data in the 24 hours ending 08/24/17 1434 There were no vitals filed for this visit.  Exam  General: Well developed, well nourished, NAD, appears stated age  HEENT:  NCAT, mucous membranes moist.   Cardiovascular: S1 S2 auscultated, no rubs, murmurs or gallops. Regular rate and rhythm.  Respiratory: Clear to auscultation bilaterally with equal chest rise  Abdomen: Soft, nontender, nondistended, + bowel sounds  Extremities: warm dry without cyanosis clubbing or edema  Neuro: AAOx3, nonfocal  Psych: Normal affect and demeanor  Data Reviewed: I have personally reviewed following labs and imaging studies  CBC:  Recent Labs Lab 08/22/17 1131 08/22/17 1757 08/23/17 0400 08/24/17 0515  WBC 5.9 6.8 7.3 6.9  NEUTROABS 3.2 3.5  --   --   HGB 11.8* 12.8* 11.3* 11.4*  HCT 36.4* 37.2* 33.8* 34.5*  MCV 89 85.3 85.1 86.5  PLT 366 352 358 345   Basic Metabolic Panel:  Recent Labs Lab 08/22/17 1131 08/22/17 1757 08/24/17 0515  NA 138 137 136  K 4.5 4.2 4.0  CL 101 105 103  CO2 22 24 23   GLUCOSE 240* 108* 111*  BUN 32* 29* 34*  CREATININE 1.16 1.32* 1.37*  CALCIUM 9.5 9.4 8.7*   GFR: Estimated Creatinine Clearance: 48.8 mL/min (A) (by C-G formula based on SCr of 1.37 mg/dL (H)). Liver Function Tests:  Recent Labs Lab 08/22/17 1131 08/22/17 1757  AST 25 35  ALT 18 25  ALKPHOS 71 73  BILITOT 0.4 0.5  PROT 7.1 8.1  ALBUMIN 4.0 4.0   No results for input(s): LIPASE, AMYLASE in the last 168 hours. No results for input(s): AMMONIA in the last 168 hours. Coagulation Profile:  Recent Labs Lab 08/22/17 1757  INR 1.12   Cardiac Enzymes: No results for input(s): CKTOTAL, CKMB, CKMBINDEX, TROPONINI in the last 168 hours. BNP (last 3 results) No results for input(s): PROBNP in the last 8760 hours. HbA1C:  Recent Labs  08/22/17 1131 08/23/17 0400  HGBA1C 8.5* 8.1*   CBG:  Recent Labs Lab 08/23/17 0737 08/23/17 1119 08/23/17 1657 08/23/17 2206 08/24/17 0623  GLUCAP 99 137* 110* 128* 114*   Lipid Profile:  Recent Labs  08/23/17 0400  CHOL 135  HDL 30*  LDLCALC 80  TRIG 161  CHOLHDL 4.5   Thyroid Function  Tests: No results for input(s): TSH, T4TOTAL, FREET4, T3FREE, THYROIDAB in the last 72 hours. Anemia Panel: No results for input(s): VITAMINB12, FOLATE, FERRITIN, TIBC, IRON, RETICCTPCT in the last 72 hours. Urine analysis:    Component Value Date/Time   COLORURINE YELLOW 08/22/2017 1835   APPEARANCEUR HAZY (A) 08/22/2017 1835   LABSPEC 1.016 08/22/2017 1835   PHURINE 5.0 08/22/2017 1835   GLUCOSEU NEGATIVE 08/22/2017 1835   HGBUR NEGATIVE 08/22/2017 1835   BILIRUBINUR NEGATIVE 08/22/2017 1835   KETONESUR NEGATIVE 08/22/2017 1835   PROTEINUR 100 (A) 08/22/2017 1835   NITRITE NEGATIVE 08/22/2017 1835   LEUKOCYTESUR NEGATIVE 08/22/2017 1835   Sepsis Labs: @LABRCNTIP (procalcitonin:4,lacticidven:4)  )No results found for this or any previous visit (from the past 240 hour(s)).    Radiology Studies: Mr Brain Wo Contrast  Result Date: 08/22/2017 CLINICAL DATA:  Word-finding difficulties. EXAM: MRI HEAD WITHOUT CONTRAST MRA HEAD WITHOUT CONTRAST TECHNIQUE: Multiplanar, multiecho pulse sequences of the brain and surrounding structures were obtained without intravenous contrast. Angiographic images of the head were obtained using MRA technique without contrast. COMPARISON:  None. FINDINGS: MRI HEAD FINDINGS Brain: The midline structures are normal.  There is focal, predominantly cortical diffusion restriction within the left parietal lobe, within the posterior left MCA territory. Punctate foci of elevated signal on diffusion-weighted imaging in the posterior right parietal lobe may be artifactual. There is encephalomalacia in the right parietal lobe with subcortical gliosis and areas of hyperintense T1 weighted signal suggesting laminar necrosis. There is gyral swelling in the posterior left MCA territory. No acute hemorrhage. No chronic microhemorrhage or cerebral amyloid angiopathy. No hydrocephalus, age advanced atrophy or lobar predominant volume loss. No dural abnormality or extra-axial  collection. Skull and upper cervical spine: The visualized skull base, calvarium, upper cervical spine and extracranial soft tissues are normal. Sinuses/Orbits: No fluid levels or advanced mucosal thickening. No mastoid effusion. Normal orbits. MRA HEAD FINDINGS Intracranial internal carotid arteries: Normal. Anterior cerebral arteries: Normal. Middle cerebral arteries: Normal. Posterior communicating arteries: Present on the left. Posterior cerebral arteries: Normal. Basilar artery: Normal. Vertebral arteries: Codominant. Normal. Superior cerebellar arteries: Normal. Anterior inferior cerebellar arteries: Normal left Posterior inferior cerebellar arteries: Normal right IMPRESSION: 1. Acute ischemic infarct within the posterior left middle cerebral artery territory, predominantly involving the left parietal cortex. 2. Nonacute right parietal lobe infarct with associated encephalomalacia and laminar necrosis. Areas of increased signal on diffusion-weighted imaging in this distribution are probably artifactual, possibly due to susceptibility effects from mineralization or old blood products at this site. 3. No large vessel occlusion or other significant vascular abnormality on MRA. Electronically Signed   By: Deatra RobinsonKevin  Herman M.D.   On: 08/22/2017 21:58   Mr Maxine GlennMra Head Wo Contrast  Result Date: 08/22/2017 CLINICAL DATA:  Word-finding difficulties. EXAM: MRI HEAD WITHOUT CONTRAST MRA HEAD WITHOUT CONTRAST TECHNIQUE: Multiplanar, multiecho pulse sequences of the brain and surrounding structures were obtained without intravenous contrast. Angiographic images of the head were obtained using MRA technique without contrast. COMPARISON:  None. FINDINGS: MRI HEAD FINDINGS Brain: The midline structures are normal. There is focal, predominantly cortical diffusion restriction within the left parietal lobe, within the posterior left MCA territory. Punctate foci of elevated signal on diffusion-weighted imaging in the posterior  right parietal lobe may be artifactual. There is encephalomalacia in the right parietal lobe with subcortical gliosis and areas of hyperintense T1 weighted signal suggesting laminar necrosis. There is gyral swelling in the posterior left MCA territory. No acute hemorrhage. No chronic microhemorrhage or cerebral amyloid angiopathy. No hydrocephalus, age advanced atrophy or lobar predominant volume loss. No dural abnormality or extra-axial collection. Skull and upper cervical spine: The visualized skull base, calvarium, upper cervical spine and extracranial soft tissues are normal. Sinuses/Orbits: No fluid levels or advanced mucosal thickening. No mastoid effusion. Normal orbits. MRA HEAD FINDINGS Intracranial internal carotid arteries: Normal. Anterior cerebral arteries: Normal. Middle cerebral arteries: Normal. Posterior communicating arteries: Present on the left. Posterior cerebral arteries: Normal. Basilar artery: Normal. Vertebral arteries: Codominant. Normal. Superior cerebellar arteries: Normal. Anterior inferior cerebellar arteries: Normal left Posterior inferior cerebellar arteries: Normal right IMPRESSION: 1. Acute ischemic infarct within the posterior left middle cerebral artery territory, predominantly involving the left parietal cortex. 2. Nonacute right parietal lobe infarct with associated encephalomalacia and laminar necrosis. Areas of increased signal on diffusion-weighted imaging in this distribution are probably artifactual, possibly due to susceptibility effects from mineralization or old blood products at this site. 3. No large vessel occlusion or other significant vascular abnormality on MRA. Electronically Signed   By: Deatra RobinsonKevin  Herman M.D.   On: 08/22/2017 21:58     Scheduled Meds: . aspirin EC  81 mg Oral Daily  .  atorvastatin  40 mg Oral q1800  . clopidogrel  75 mg Oral Daily  . dorzolamide  1 drop Left Eye BID  . insulin aspart  0-15 Units Subcutaneous TID WC  . insulin detemir  15  Units Subcutaneous Daily  . pantoprazole  40 mg Oral Daily  . prednisoLONE acetate  1 drop Left Eye TID  . ramipril  5 mg Oral Daily  . spironolactone  25 mg Oral Daily  . timolol  1 drop Both Eyes BID   Continuous Infusions: . heparin 900 Units/hr (08/24/17 1013)     LOS: 2 days   Time Spent in minutes   30 minutes  Peter Cole D.O. on 08/24/2017 at 2:34 PM  Between 7am to 7pm - Pager - [phone]  After 7pm go to [email or web address] - password [email or web address]  And look for the night coverage person covering for me after hours  [facility] Office  [phone]

## 2017-08-25 ENCOUNTER — Encounter (HOSPITAL_COMMUNITY)
Admission: EM | Disposition: A | Discharge status: Home / Self Care | Payer: Self-pay | Source: Home / Self Care | Attending: Internal Medicine

## 2017-08-25 ENCOUNTER — Inpatient Hospital Stay (HOSPITAL_COMMUNITY)
Admit: 2017-08-25 | Discharge: 2017-08-25 | Disposition: A | Discharge status: Home / Self Care | Payer: Medicare Other | Attending: Cardiology | Admitting: Cardiology

## 2017-08-25 ENCOUNTER — Encounter (HOSPITAL_COMMUNITY): Payer: Self-pay

## 2017-08-25 HISTORY — PX: TEE WITHOUT CARDIOVERSION: SHX5443

## 2017-08-25 LAB — HEPARIN LEVEL (UNFRACTIONATED)
HEPARIN UNFRACTIONATED: 0.69 [IU]/mL (ref 0.30–0.70)
HEPARIN UNFRACTIONATED: 0.17 [IU]/mL — AB (ref 0.30–0.70)

## 2017-08-25 LAB — CBC
HEMATOCRIT: 33.3 % — AB (ref 39.0–52.0)
HEMOGLOBIN: 11.1 g/dL — AB (ref 13.0–17.0)
MCH: 28.5 pg (ref 26.0–34.0)
MCHC: 33.3 g/dL (ref 30.0–36.0)
MCV: 85.4 fL (ref 78.0–100.0)
Platelets: 342 10*3/uL (ref 150–400)
RBC: 3.9 MIL/uL — AB (ref 4.22–5.81)
RDW: 14.7 % (ref 11.5–15.5)
WBC: 7 10*3/uL (ref 4.0–10.5)

## 2017-08-25 LAB — GLUCOSE, CAPILLARY
GLUCOSE-CAPILLARY: 77 mg/dL (ref 65–99)
Glucose-Capillary: 99 mg/dL (ref 65–99)
Glucose-Capillary: 72 mg/dL (ref 65–99)
Glucose-Capillary: 58 mg/dL — ABNORMAL LOW (ref 65–99)
Glucose-Capillary: 92 mg/dL (ref 65–99)

## 2017-08-25 SURGERY — ECHOCARDIOGRAM, TRANSESOPHAGEAL
Anesthesia: Moderate Sedation

## 2017-08-25 MED ORDER — DEXTROSE 50 % IV SOLN
INTRAVENOUS | Status: AC
  Filled 2017-08-25: qty 50

## 2017-08-25 MED ORDER — FENTANYL CITRATE (PF) 100 MCG/2ML IJ SOLN
INTRAMUSCULAR | Status: AC
  Filled 2017-08-25: qty 2

## 2017-08-25 MED ORDER — SODIUM CHLORIDE 0.9 % IV SOLN: INTRAVENOUS | Status: DC

## 2017-08-25 MED ORDER — BUTAMBEN-TETRACAINE-BENZOCAINE 2-2-14 % EX AERO
INHALATION_SPRAY | CUTANEOUS | Status: DC | PRN
  Administered 2017-08-25: 2 via TOPICAL

## 2017-08-25 MED ORDER — MIDAZOLAM HCL 5 MG/ML IJ SOLN
INTRAMUSCULAR | Status: AC
  Filled 2017-08-25: qty 2

## 2017-08-25 MED ORDER — DIPHENHYDRAMINE HCL 50 MG/ML IJ SOLN
INTRAMUSCULAR | Status: AC
  Filled 2017-08-25: qty 1

## 2017-08-25 MED ORDER — ATORVASTATIN CALCIUM 40 MG PO TABS: 40.0000 mg | ORAL_TABLET | Freq: Every day | ORAL | 0 refills | Status: DC

## 2017-08-25 MED ORDER — MIDAZOLAM HCL 10 MG/2ML IJ SOLN
INTRAMUSCULAR | Status: DC | PRN
  Administered 2017-08-25: 2 mg via INTRAVENOUS

## 2017-08-25 MED ORDER — DIPHENHYDRAMINE HCL 50 MG/ML IJ SOLN
INTRAMUSCULAR | Status: DC | PRN
  Administered 2017-08-25: 25 mg via INTRAVENOUS

## 2017-08-25 MED ORDER — FENTANYL CITRATE (PF) 100 MCG/2ML IJ SOLN
INTRAMUSCULAR | Status: DC | PRN
  Administered 2017-08-25: 25 ug via INTRAVENOUS

## 2017-08-25 NOTE — Progress Notes (Signed)
ANTICOAGULATION CONSULT NOTE   Pharmacy Consult for heparin dosing Indication:  CVA  No Known Allergies  Patient Measurements:   Heparin Dosing Weight: 71.6 kg  Vital Signs: Temp: 98.1 F (36.7 C) (10/19 1714) Temp Source: Oral (10/19 1714) BP: 109/58 (10/19 1714) Pulse Rate: 52 (10/19 1714)  Labs:  Recent Labs  08/22/17 1757 08/23/17 0400  08/24/17 0515 08/24/17 1603 08/25/17 0509 08/25/17 1629  HGB 12.8* 11.3*  --  11.4*  --  11.1*  --   HCT 37.2* 33.8*  --  34.5*  --  33.3*  --   PLT 352 358  --  345  --  342  --   APTT 31  --   --   --   --   --   --   LABPROT 14.3  --   --   --   --   --   --   INR 1.12  --   --   --   --   --   --   HEPARINUNFRC  --  0.26*  < > 0.73* 0.59 0.69 0.17*  CREATININE 1.32*  --   --  1.37*  --   --   --   < > = values in this interval not displayed.  Estimated Creatinine Clearance: 48.8 mL/min (A) (by C-G formula based on SCr of 1.37 mg/dL (H)).  Assessment: 66 y.o. 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 Patient had a TEE today. RN unsure if IV heparin was stopped for procedure. He is noted with recent MI (cath in 04/1017) on aspirin and plavix and recently stopped his coumadin.   Goal of Therapy:  Heparin level 0.3-0.5 units/ml Monitor platelets by anticoagulation protocol: Yes   Plan:  -Continue IV heparin at 750 units/hr as heparin was likely held for procedure today -Heparin level in 6 hours and daily wth CBC daily   Vinnie Level, PharmD., BCPS Clinical Pharmacist Pager [phone]

## 2017-08-25 NOTE — Care Management Note (Signed)
Case Management Note  Patient Details  Name: Peter Cole MRN: [id number] Date of Birth: [birth year]  Subjective/Objective:                    Action/Plan: CM consulted for PCP and outpatient therapy. CM met with the patient and using interpretor asked about a PCP. Pt states he goes to Primary Care at [facility].  CM inquired about outpatient therapy. Pt states he would not have transportation to the sessions. MD updated.  Pt's son can provide transportation home when ready.    Expected Discharge Date:                  Expected Discharge Plan:  OP Rehab  In-House Referral:     Discharge planning Services  CM Consult  Post Acute Care Choice:    Choice offered to:     DME Arranged:    DME Agency:     HH Arranged:    [place] Agency:     Status of Service:  Completed, signed off  If discussed at [employer] of Stay Meetings, dates discussed:    Additional Comments:  Pollie Friar, RN 08/25/2017, 1:45 PM

## 2017-08-25 NOTE — CV Procedure (Signed)
TEE: Under moderate sedation, TEE was performed without complications:  Impression: No cardiac source of cerebral emboli identified.  LV: Minimally dilated in appearance. Mid to distal anterior, anterolateral and apical akinesis. No mural thrombus. EF 30-35% RV: Normal LA: Normal. Left atrial appendage: Normal without thrombus. Normal function. Inter atrial septum is intact without defect. Double contrast study negative for atrial level shunting. No late appearance of bubbles either. RA: Normal  MV: Normal Trace MR. TV: Normal Trace TR AV: Normal. No AI or AS. PV: Normal. Trace PI.  Thoracic and ascending aorta: Minimal plaque.   Conscious sedation protocol was followed, I personally administered conscious sedation and monitored the patient. Patient received 2 milligrams of Versed and 25 mcg  fentanyl and 25 mg of benadryl . Patient tolerated the procedure well and there was no complication from conscious sedation. Time administered was 20 minutes.

## 2017-08-25 NOTE — Progress Notes (Signed)
Hypoglycemic Event  CBG: 63 at 1437  Treatment: D50 IV 25 mL  Symptoms: None  Follow-up CBG: Time:1451 CBG Result: 138 Possible Reasons for Event: Inadequate meal intake; NPO for TEE  Comments/MD notified:Dr Franne Grip L Sameer Teeple

## 2017-08-25 NOTE — Progress Notes (Signed)
ANTICOAGULATION CONSULT NOTE   Pharmacy Consult for heparin dosing Indication:  CVA  No Known Allergies  Patient Measurements:   Heparin Dosing Weight: 71.6 kg  Vital Signs: Temp: 98.6 F (37 C) (10/19 0447) Temp Source: Oral (10/19 0447) BP: 95/54 (10/19 0447) Pulse Rate: 49 (10/19 0447)  Labs:  Recent Labs  08/22/17 1131  08/22/17 1757 08/23/17 0400  08/24/17 0515 08/24/17 1603 08/25/17 0509  HGB 11.8*  < > 12.8* 11.3*  --  11.4*  --  11.1*  HCT 36.4*  < > 37.2* 33.8*  --  34.5*  --  33.3*  PLT 366  < > 352 358  --  345  --  342  APTT  --   --  31  --   --   --   --   --   LABPROT  --   --  14.3  --   --   --   --   --   INR  --   --  1.12  --   --   --   --   --   HEPARINUNFRC  --   --   --  0.26*  < > 0.73* 0.59 0.69  CREATININE 1.16  --  1.32*  --   --  1.37*  --   --   < > = values in this interval not displayed.  Estimated Creatinine Clearance: 48.8 mL/min (A) (by C-G formula based on SCr of 1.37 mg/dL (H)).  Assessment: 66 y.o. 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 He is noted with recent MI (cath in 04/1017) on aspirin and plavix and recently stopped his coumadin. Plans noted for echo today -heparin level remains above goal  Goal of Therapy:  Heparin level 0.3-0.5 units/ml Monitor platelets by anticoagulation protocol: Yes   Plan:  -Decrease heparin to 750 units/hr -Heparin level in 6 hours and daily wth CBC daily   Harland German, Pharm D 08/25/2017 9:59 AM

## 2017-08-25 NOTE — Progress Notes (Signed)
STROKE TEAM PROGRESS NOTE   HISTORY OF PRESENT ILLNESS (per record) Peter Cole is an 66 y.o. male with a PMHx of low EF of 15-20% on echocardiogram, large left mural thrombus, CAD s/p CABG and MI who presents with dysarthria and expressive aphasia approximately one month after stopping Coumadin. The patient stated that his doctor told him to stop taking it, but this is likely due to a misunderstanding of instructions. He has remained on ASA and Plavix.   He presented to the ED on Tuesday from his PCPs office for stroke evaluation. He has had a 1 week history of difficulty with speech, including word-finding problems and dysarthria. He states in Spanish to the interpreter that he has had trouble getting the words out, but can understand what is being said by others. He had a CT performed at [facility] on Tuesday which revealed findings consistent with subacute strokes.   PMHx also includes DM, HLD, ischemic cardiomyopathy, and CKD3.   Patient was not administered IV t-PA secondary to timing of presentation.   SUBJECTIVE (INTERVAL HISTORY)  Patient was examined with Spanish language interpreter at the bedside. He states subjectively he is still having trouble speaking but apparently was speaking quite fluent in [place]. He has no other complaints. Transthoracic echo shows diminished ejection fraction but no definite clot is noted. I discussed this case with Dr.  Jacinto Halim who agreed to perform TEE tomorrow to look for clot. Patient apparently was noncompliant with his warfarin given prior to being discontinued   OBJECTIVE   CBC:   Recent Labs Lab 08/22/17 1131 08/22/17 1757  08/24/17 0515 08/25/17 0509  WBC 5.9 6.8  < > 6.9 7.0  NEUTROABS 3.2 3.5  --   --   --   HGB 11.8* 12.8*  < > 11.4* 11.1*  HCT 36.4* 37.2*  < > 34.5* 33.3*  MCV 89 85.3  < > 86.5 85.4  PLT 366 352  < > 345 342  < > = values in this interval not displayed.  Basic Metabolic Panel:   Recent Labs Lab  08/22/17 1757 08/24/17 0515  NA 137 136  K 4.2 4.0  CL 105 103  CO2 24 23  GLUCOSE 108* 111*  BUN 29* 34*  CREATININE 1.32* 1.37*  CALCIUM 9.4 8.7*    Lipid Panel:     Component Value Date/Time   CHOL 135 08/23/2017 0400   CHOL 147 08/07/2017 1039   TRIG 126 08/23/2017 0400   HDL 30 (L) 08/23/2017 0400   HDL 33 (L) 08/07/2017 1039   CHOLHDL 4.5 08/23/2017 0400   VLDL 25 08/23/2017 0400   LDLCALC 80 08/23/2017 0400   LDLCALC 70 08/07/2017 1039   HgbA1c:  Lab Results  Component Value Date   HGBA1C 8.1 (H) 08/23/2017   Urine Drug Screen:     Component Value Date/Time   LABOPIA NONE DETECTED 08/22/2017 1835   COCAINSCRNUR NONE DETECTED 08/22/2017 1835   LABBENZ NONE DETECTED 08/22/2017 1835   AMPHETMU NONE DETECTED 08/22/2017 1835   THCU NONE DETECTED 08/22/2017 1835   LABBARB NONE DETECTED 08/22/2017 1835    Alcohol Level     Component Value Date/Time   ETH <10 08/22/2017 1757    IMAGING  Ct Head Wo Contrast 08/22/2017 New peripheral low-attenuation areas within the posterior left parietal and posterior right parietal lobes consistent with nonhemorrhagic infarcts, acute or subacute. MRI may be helpful for further assessment.  Peter Cole 62 Contrast Peter [name] Head Wo Contrast 08/22/2017 1. Acute ischemic infarct  within the posterior left middle cerebral artery territory, predominantly involving the left parietal cortex. 2. Nonacute right parietal lobe infarct with associated encephalomalacia and laminar necrosis. Areas of increased signal on diffusion-weighted imaging in this distribution are probably artifactual, possibly due to susceptibility effects from mineralization or old blood products at this site. 3. No large vessel occlusion or other significant vascular abnormality on MRA.  2D Echocardiogram 08/23/2017 LVEF 30-35%. Akinesis of the anterior wall, septum and apex; overall moderate to severe LV dysfunction; mild diastolic dysfunction; mild LVE; no LV  thrombus noted using definity; mild LAE; trace Peter and TR.  Carotid Doppler US 08/23/2017 Bilateral: No significant ICA stenosis. Antegrade vertebral flow.   PHYSICAL EXAM Temp:  [98.1 F (36.7 C)-98.6 F (37 C)] 98.1 F (36.7 C) (10/19 1714) Pulse Rate:  [45-70] 52 (10/19 1714) Resp:  [12-23] 17 (10/19 1714) BP: (95-165)/(38-79) 109/58 (10/19 1714) SpO2:  [95 %-100 %] 97 % (10/19 1714)  General - Well nourished, well developed, in no apparent distress.  Mental Status -  Language including expression, naming, repetition, comprehension was assessed and found intact. But has some word hesitancy and dysfluency Attention span and concentration were normal. Recent and remote memory were intact.  Cranial Nerves II - XII - II - Visual field intact OU. III, IV, VI - Extraocular movements intact. V - Facial sensation intact bilaterally. VII - Facial movement intact bilaterally. VIII - Hearing & vestibular intact bilaterally. X - Palate elevates symmetrically. XI - Chin turning & shoulder shrug intact bilaterally. XII - Tongue protrusion intact.  Motor Strength - Strength is 5/5 in all extremities following verbal commands and moving spontaneously  Motor Tone - Muscle tone was normal  Reflexes - The patient's reflexes were 1+ in all extremities and he had no pathological reflexes.  Sensory - Light touc, was assessed and was symmetrical.    Coordination - The patient had normal movements in the hands and feet with no ataxia or dysmetria.  Tremor was absent.  Gait and Station - Not assessed   ASSESSMENT/PLAN Peter Cole is a 66 y.o. male with history of systolic heart failure with severely reduced LVEF after MI in June 2018, large left mural thrombus, CAD s/p CABG presenting with dysarthria and expressive aphasia. He did not receive IV t-PA due to timing of presentation.   Stroke:  Left MCA infarct embolic with recent discontinuation of anticoagulation for previous mural  thrombus after acute MI in June. Unclear if imaging showing resolution of clot was checked or if arrhythmias are present.  Resultant  Mild expressive aphasia  CT head New peripheral low-attenuation areas within the posterior left parietal and posterior right parietal lobes consistent with nonhemorrhagic infarcts, acute or subacute  MRI head Acute ischemic infarct within the posterior left middle cerebral artery territory, predominantly involving the left parietal cortex.  MRA head No large vessel occlusion or other significant vascular abnormality  Carotid Doppler - Bilateral: No significant ICA stenosis. Antegrade vertebral flow.   2D Echo  LVEF 30-35%. Akinesis of the anterior wall, septum and apex; no LV thrombus noted using definity  LDL 80  HgbA1c 8.1%  Therapeutic heparin for VTE prophylaxis Diet Carb Modified Fluid consistency: Thin; Room service appropriate? Yes  aspirin 81 mg daily and clopidogrel 75 mg daily prior to admission, now on heparin IV  Patient counseled to be compliant with his antithrombotic medications  Ongoing aggressive stroke risk factor management  Therapy recommendations:  PT pending, outpatient SLP  Disposition:  Pending likely home  Hypertension  Stable  Permissive hypertension not indicated as strongly with subacute presentation  Long-term BP goal normotensive <130/90  Hyperlipidemia  Home meds:  none  LDL 80, goal < 70  Add atorvastatin 40mg   Continue statin at discharge  Diabetes  HgbA1c 8.1%, goal < 7.0  Uncontrolled  Other Stroke Risk Factors  Advanced age  Coronary artery disease  EF 30 - 35 %  Other Active Problems  Possible cardiac central embolic source. Unclear if he needs continued long term anticoagulation, likely to benefit given new embolic stroke although will try to get TEE first and make final determination after that.continue IV heparin for now. If no clot noted on TEE discharge on aspirin and Plavix.due to  his history of noncompliance he may not be the best candidate for loop recorder insertion and long-term anticoagulation even if A. Fib is found  Hospital day # 3      To contact Stroke Continuity provider, please refer to [email or web address]. After hours, contact General Neurology

## 2017-08-25 NOTE — H&P (View-Only) (Signed)
PROGRESS NOTE    Peter Cole  ZOX:[id number] DOB: 05/04/1951 DOA: 08/22/2017 PCP: Patient, No Pcp Per   Chief Complaint  Patient presents with  . Stroke Symptoms    Brief Narrative:  HPI 08/22/2017 by Dr. Lyda Perone Peter Cole is a 66 y.o. male with medical history significant of CAD s/p CABG.  MI in June 2018 in [place].  Echo at that time showed EF 15-20% and a left mural thrombus.  As a result he was started on Coumadin.  He also had heart cath at that time and is on ASA / Plavix as well as Coumadin (not entirely sure that stent was placed but son thinks so. Per Son: patient was reportedly told to stop taking coumadin ~1 month ago and has just been on ASA / plavix since then. Patient presents to the ED after being sent in by PCP today, with c/o 1-2 week history of difficulty with word finding and speech.  CT scan performed at [facility] today demonstrated findings concerning for acute / subacute strokes. Speaks spanish, son acts as Nurse, learning disability.  Assessment & Plan   Acute CVA -CT head shows new peripheral low-attenuation areas within the posterior left parietal and posterior right parietal lobes consistent with nonhemorrhagic infarcts, acute or subacute -MRI head showed acute ischemic infarct within the posterior left medial cerebral artery territory, predominantly involving the left parietal cortex. Nonacute right parietal lobe infarct with associated encephalomalacia and laminar necrosis. -MRA head: No large vessel occlusion or other significant vascular abnormality on MRA -Neurology consulted and appreciated -Echocardiogram shows an EF of 30-35%, grade 1 diastolic dysfunction -Carotid Doppler shows no significant ICA stenosis, antegrade vertebral flow -LDL 80 (Goal < 70) -Hemoglobin A1c 8.1 -Continue aspirin, Plavix, statin -Pending PT, OT, speech -pending further recommendations from neurology- discussed with Dr. Pearlean Brownie, would like patient to get a TEE -Cardiology, Dr.  Jacinto Halim, consulted and appreciated  Ischemic cardiomyopathy/ chronic combined CHF -Echocardiogram showed EF of 30-35%, grade 1 diastolic dysfunction (EF improved from 15-20% back in August 2018) -Patient follows with EP, Dr. Elberta Fortis -Currently has LifeVest in place however per outpatient note, from 08/15/2017, was supposed to be discontinued -Will attempt to contact EP to discuss -Currently appears to be euvolemic and compensated -Continue ramipril, spironolactone  Diabetes mellitus, type II -Continue Levemir, and some fine scale CBG monitoring -Hemoglobin A1c 8.1  Chronic kidney disease, stage III -Creatinine stable, continue to monitor BMP   History of mural thrombus -Discussed with Dr. Jacinto Halim, cardiologist, via phone. Patient was on coumadin for appropriate amount of time. While he was taking it, his INR was normally supratherapeutic. Patient does have some noncompliance with medications as well  DVT Prophylaxis  Heparin  Code Status: Full  Family Communication: None at bedside  Disposition Plan: Admitted, pending workup and further recommendations from neurology  Consultants Neurology Cardiology   Procedures  Echocardiogram Carotid doppler  Antibiotics   Anti-infectives    None      Subjective:   Peter Cole seen and examined today.  No complaints today. Currently denies chest pain, shortness of breath, abdominal pain, N/V/D/C, dizziness, headache.     Objective:   Vitals:   08/23/17 2110 08/24/17 0107 08/24/17 0457 08/24/17 0959  BP: (!) 93/47 (!) 114/56 (!) 100/53 (!) 107/50  Pulse: (!) 57 (!) 52 (!) 59 (!) 52  Resp: 18 18 18 20   Temp: 98.6 F (37 C) 98.8 F (37.1 C) 98.1 F (36.7 C) 98.2 F (36.8 C)  TempSrc: Oral Oral Oral Oral  SpO2:  98% 99% 100% 100%   No intake or output data in the 24 hours ending 08/24/17 1434 There were no vitals filed for this visit.  Exam  General: Well developed, well nourished, NAD, appears stated age  HEENT:  NCAT, mucous membranes moist.   Cardiovascular: S1 S2 auscultated, no rubs, murmurs or gallops. Regular rate and rhythm.  Respiratory: Clear to auscultation bilaterally with equal chest rise  Abdomen: Soft, nontender, nondistended, + bowel sounds  Extremities: warm dry without cyanosis clubbing or edema  Neuro: AAOx3, nonfocal  Psych: Normal affect and demeanor  Data Reviewed: I have personally reviewed following labs and imaging studies  CBC:  Recent Labs Lab 08/22/17 1131 08/22/17 1757 08/23/17 0400 08/24/17 0515  WBC 5.9 6.8 7.3 6.9  NEUTROABS 3.2 3.5  --   --   HGB 11.8* 12.8* 11.3* 11.4*  HCT 36.4* 37.2* 33.8* 34.5*  MCV 89 85.3 85.1 86.5  PLT 366 352 358 345   Basic Metabolic Panel:  Recent Labs Lab 08/22/17 1131 08/22/17 1757 08/24/17 0515  NA 138 137 136  K 4.5 4.2 4.0  CL 101 105 103  CO2 22 24 23   GLUCOSE 240* 108* 111*  BUN 32* 29* 34*  CREATININE 1.16 1.32* 1.37*  CALCIUM 9.5 9.4 8.7*   GFR: Estimated Creatinine Clearance: 48.8 mL/min (A) (by C-G formula based on SCr of 1.37 mg/dL (H)). Liver Function Tests:  Recent Labs Lab 08/22/17 1131 08/22/17 1757  AST 25 35  ALT 18 25  ALKPHOS 71 73  BILITOT 0.4 0.5  PROT 7.1 8.1  ALBUMIN 4.0 4.0   No results for input(s): LIPASE, AMYLASE in the last 168 hours. No results for input(s): AMMONIA in the last 168 hours. Coagulation Profile:  Recent Labs Lab 08/22/17 1757  INR 1.12   Cardiac Enzymes: No results for input(s): CKTOTAL, CKMB, CKMBINDEX, TROPONINI in the last 168 hours. BNP (last 3 results) No results for input(s): PROBNP in the last 8760 hours. HbA1C:  Recent Labs  08/22/17 1131 08/23/17 0400  HGBA1C 8.5* 8.1*   CBG:  Recent Labs Lab 08/23/17 0737 08/23/17 1119 08/23/17 1657 08/23/17 2206 08/24/17 0623  GLUCAP 99 137* 110* 128* 114*   Lipid Profile:  Recent Labs  08/23/17 0400  CHOL 135  HDL 30*  LDLCALC 80  TRIG 161  CHOLHDL 4.5   Thyroid Function  Tests: No results for input(s): TSH, T4TOTAL, FREET4, T3FREE, THYROIDAB in the last 72 hours. Anemia Panel: No results for input(s): VITAMINB12, FOLATE, FERRITIN, TIBC, IRON, RETICCTPCT in the last 72 hours. Urine analysis:    Component Value Date/Time   COLORURINE YELLOW 08/22/2017 1835   APPEARANCEUR HAZY (A) 08/22/2017 1835   LABSPEC 1.016 08/22/2017 1835   PHURINE 5.0 08/22/2017 1835   GLUCOSEU NEGATIVE 08/22/2017 1835   HGBUR NEGATIVE 08/22/2017 1835   BILIRUBINUR NEGATIVE 08/22/2017 1835   KETONESUR NEGATIVE 08/22/2017 1835   PROTEINUR 100 (A) 08/22/2017 1835   NITRITE NEGATIVE 08/22/2017 1835   LEUKOCYTESUR NEGATIVE 08/22/2017 1835   Sepsis Labs: @LABRCNTIP (procalcitonin:4,lacticidven:4)  )No results found for this or any previous visit (from the past 240 hour(s)).    Radiology Studies: Mr Brain Wo Contrast  Result Date: 08/22/2017 CLINICAL DATA:  Word-finding difficulties. EXAM: MRI HEAD WITHOUT CONTRAST MRA HEAD WITHOUT CONTRAST TECHNIQUE: Multiplanar, multiecho pulse sequences of the brain and surrounding structures were obtained without intravenous contrast. Angiographic images of the head were obtained using MRA technique without contrast. COMPARISON:  None. FINDINGS: MRI HEAD FINDINGS Brain: The midline structures are normal.  There is focal, predominantly cortical diffusion restriction within the left parietal lobe, within the posterior left MCA territory. Punctate foci of elevated signal on diffusion-weighted imaging in the posterior right parietal lobe may be artifactual. There is encephalomalacia in the right parietal lobe with subcortical gliosis and areas of hyperintense T1 weighted signal suggesting laminar necrosis. There is gyral swelling in the posterior left MCA territory. No acute hemorrhage. No chronic microhemorrhage or cerebral amyloid angiopathy. No hydrocephalus, age advanced atrophy or lobar predominant volume loss. No dural abnormality or extra-axial  collection. Skull and upper cervical spine: The visualized skull base, calvarium, upper cervical spine and extracranial soft tissues are normal. Sinuses/Orbits: No fluid levels or advanced mucosal thickening. No mastoid effusion. Normal orbits. MRA HEAD FINDINGS Intracranial internal carotid arteries: Normal. Anterior cerebral arteries: Normal. Middle cerebral arteries: Normal. Posterior communicating arteries: Present on the left. Posterior cerebral arteries: Normal. Basilar artery: Normal. Vertebral arteries: Codominant. Normal. Superior cerebellar arteries: Normal. Anterior inferior cerebellar arteries: Normal left Posterior inferior cerebellar arteries: Normal right IMPRESSION: 1. Acute ischemic infarct within the posterior left middle cerebral artery territory, predominantly involving the left parietal cortex. 2. Nonacute right parietal lobe infarct with associated encephalomalacia and laminar necrosis. Areas of increased signal on diffusion-weighted imaging in this distribution are probably artifactual, possibly due to susceptibility effects from mineralization or old blood products at this site. 3. No large vessel occlusion or other significant vascular abnormality on MRA. Electronically Signed   By: Deatra RobinsonKevin  Herman M.D.   On: 08/22/2017 21:58   Mr Maxine GlennMra Head Wo Contrast  Result Date: 08/22/2017 CLINICAL DATA:  Word-finding difficulties. EXAM: MRI HEAD WITHOUT CONTRAST MRA HEAD WITHOUT CONTRAST TECHNIQUE: Multiplanar, multiecho pulse sequences of the brain and surrounding structures were obtained without intravenous contrast. Angiographic images of the head were obtained using MRA technique without contrast. COMPARISON:  None. FINDINGS: MRI HEAD FINDINGS Brain: The midline structures are normal. There is focal, predominantly cortical diffusion restriction within the left parietal lobe, within the posterior left MCA territory. Punctate foci of elevated signal on diffusion-weighted imaging in the posterior  right parietal lobe may be artifactual. There is encephalomalacia in the right parietal lobe with subcortical gliosis and areas of hyperintense T1 weighted signal suggesting laminar necrosis. There is gyral swelling in the posterior left MCA territory. No acute hemorrhage. No chronic microhemorrhage or cerebral amyloid angiopathy. No hydrocephalus, age advanced atrophy or lobar predominant volume loss. No dural abnormality or extra-axial collection. Skull and upper cervical spine: The visualized skull base, calvarium, upper cervical spine and extracranial soft tissues are normal. Sinuses/Orbits: No fluid levels or advanced mucosal thickening. No mastoid effusion. Normal orbits. MRA HEAD FINDINGS Intracranial internal carotid arteries: Normal. Anterior cerebral arteries: Normal. Middle cerebral arteries: Normal. Posterior communicating arteries: Present on the left. Posterior cerebral arteries: Normal. Basilar artery: Normal. Vertebral arteries: Codominant. Normal. Superior cerebellar arteries: Normal. Anterior inferior cerebellar arteries: Normal left Posterior inferior cerebellar arteries: Normal right IMPRESSION: 1. Acute ischemic infarct within the posterior left middle cerebral artery territory, predominantly involving the left parietal cortex. 2. Nonacute right parietal lobe infarct with associated encephalomalacia and laminar necrosis. Areas of increased signal on diffusion-weighted imaging in this distribution are probably artifactual, possibly due to susceptibility effects from mineralization or old blood products at this site. 3. No large vessel occlusion or other significant vascular abnormality on MRA. Electronically Signed   By: Deatra RobinsonKevin  Herman M.D.   On: 08/22/2017 21:58     Scheduled Meds: . aspirin EC  81 mg Oral Daily  .  atorvastatin  40 mg Oral q1800  . clopidogrel  75 mg Oral Daily  . dorzolamide  1 drop Left Eye BID  . insulin aspart  0-15 Units Subcutaneous TID WC  . insulin detemir  15  Units Subcutaneous Daily  . pantoprazole  40 mg Oral Daily  . prednisoLONE acetate  1 drop Left Eye TID  . ramipril  5 mg Oral Daily  . spironolactone  25 mg Oral Daily  . timolol  1 drop Both Eyes BID   Continuous Infusions: . heparin 900 Units/hr (08/24/17 1013)     LOS: 2 days   Time Spent in minutes   30 minutes  Ceyda Peterka D.O. on 08/24/2017 at 2:34 PM  Between 7am to 7pm - Pager - [phone]  After 7pm go to [email or web address] - password [email or web address]  And look for the night coverage person covering for me after hours  [facility] Office  [phone]

## 2017-08-25 NOTE — Interval H&P Note (Signed)
History and Physical Interval Note:  08/25/2017 3:23 PM  Peter Cole  has presented today for surgery, with the diagnosis of stroke  The various methods of treatment have been discussed with the patient and family. After consideration of risks, benefits and other options for treatment, the patient has consented to  Procedure(s): TRANSESOPHAGEAL ECHOCARDIOGRAM (TEE) (N/A) as a surgical intervention .  The patient's history has been reviewed, patient examined, no change in status, stable for surgery.  I have reviewed the patient's chart and labs.  Questions were answered to the patient's satisfaction.     Yates Decamp

## 2017-08-25 NOTE — Progress Notes (Signed)
Patient discharged via wheelchair from room 3W-02.  All patient's belongings with patient. Patient in stable condition.

## 2017-08-27 ENCOUNTER — Encounter (HOSPITAL_COMMUNITY): Payer: Self-pay | Admitting: Cardiology

## 2017-08-28 LAB — GLUCOSE, CAPILLARY
Glucose-Capillary: 138 mg/dL — ABNORMAL HIGH (ref 65–99)
Glucose-Capillary: 63 mg/dL — ABNORMAL LOW (ref 65–99)

## 2017-08-30 ENCOUNTER — Encounter: Payer: Self-pay | Admitting: Emergency Medicine

## 2017-08-30 ENCOUNTER — Ambulatory Visit (INDEPENDENT_AMBULATORY_CARE_PROVIDER_SITE_OTHER): Payer: Medicare Other | Admitting: Emergency Medicine

## 2017-08-30 VITALS — BP 104/62 | HR 86 | Temp 97.7°F | Resp 16 | Ht 62.0 in | Wt 172.6 lb

## 2017-08-30 DIAGNOSIS — Z09 Encounter for follow-up examination after completed treatment for conditions other than malignant neoplasm: Secondary | ICD-10-CM | POA: Diagnosis not present

## 2017-08-30 DIAGNOSIS — I255 Ischemic cardiomyopathy: Secondary | ICD-10-CM

## 2017-08-30 DIAGNOSIS — N183 Chronic kidney disease, stage 3 unspecified: Secondary | ICD-10-CM

## 2017-08-30 DIAGNOSIS — I2589 Other forms of chronic ischemic heart disease: Secondary | ICD-10-CM | POA: Diagnosis not present

## 2017-08-30 DIAGNOSIS — E1122 Type 2 diabetes mellitus with diabetic chronic kidney disease: Secondary | ICD-10-CM | POA: Diagnosis not present

## 2017-08-30 DIAGNOSIS — Z8673 Personal history of transient ischemic attack (TIA), and cerebral infarction without residual deficits: Secondary | ICD-10-CM

## 2017-08-30 NOTE — Patient Instructions (Addendum)
  Continue present care. Instructions as per Hospital d/c. Follow up in 3 months.   IF you received an x-ray today, you will receive an invoice from [facility]. Please contact [facility] at [phone] with questions or concerns regarding your invoice.   IF you received labwork today, you will receive an invoice from [facility]. Please contact [facility] at [phone] with questions or concerns regarding your invoice.   Our billing staff will not be able to assist you with questions regarding bills from these companies.  You will be contacted with the lab results as soon as they are available. The fastest way to get your results is to activate your My Chart account. Instructions are located on the last page of this paperwork. If you have not heard from [place] regarding the results in 2 weeks, please contact this office.

## 2017-08-30 NOTE — Progress Notes (Signed)
Discharge Diagnoses:  Acute CVA Ischemic cardiomyopathy/ chronic combined CHF Diabetes mellitus, type II Chronic kidney disease, stage III  Discharge Condition: stable  Diet recommendation: Heart healthy/carb modified  There were no vitals filed for this visit.  History of present illness:  On 08/22/2017 by Dr. Alexander Mt Martinezis a 66 y.o.malewith medical history significant of CAD s/p CABG. MI in June 2018 in [place]. Echo at that time showed EF 15-20% and a left mural thrombus. As a result he was started on Coumadin. He also had heart cath at that time and is on ASA / Plavix as well as Coumadin (not entirely sure that stent was placed but son thinks so. Per Son: patient was reportedly told to stop taking coumadin ~1 month ago and has just been on ASA / plavix since then. Patient presents to the ED after being sent in by PCP today, with c/o 1-2 week history of difficulty with word finding and speech. CT scan performed at [facility] today demonstrated findings concerning for acute / subacute strokes. Speaks spanish, son acts as Nurse, learning disability.  Hospital Course:  Acute CVA -CT head shows new peripheral low-attenuation areas within the posterior left parietal and posterior right parietal lobes consistent with nonhemorrhagic infarcts, acute or subacute -MRI head showed acute ischemic infarct within the posterior left medial cerebral artery territory, predominantly involving the left parietal cortex. Nonacute right parietal lobe infarct with associated encephalomalacia and laminar necrosis. -MRAhead: No large vessel occlusion or other significant vascular abnormality on MRA -Neurology consulted and appreciated, recommended TEE.  Continue aspirin and Plavix. -Echocardiogram shows an EF of 30-35%, grade 1 diastolic dysfunction -Carotid Doppler shows no significant ICA stenosis, antegrade vertebral flow -LDL 80 (Goal <70) -Hemoglobin A1c 8.1 -Continue aspirin, Plavix,  statin -PT recommended outpatient PT; no further OT needs (patient refused physical therapy as his son works and would not be able to have transportation) -S/p TEE showed no cardiac source of cerebral emboli identified.  EF 30-35%.  No mural thrombus.  Ischemic cardiomyopathy/ chronic combined CHF -Echocardiogram showed EF of 30-35%, grade 1 diastolic dysfunction (EF improved from 15-20% back in August 2018) -Patient follows with EP, Dr. Elberta Fortis -Currently has LifeVest in place however per outpatient note, from 08/15/2017, was supposed to be discontinued -Currently appears to be euvolemic and compensated -Continue ramipril, spironolactone  Diabetes mellitus, type II -Continue Levemir, and insulin sliding scale CBG monitoring -Hemoglobin A1c 8.1  Chronic kidney disease, stage III -Creatinine stable, continue to monitor BMP   History of mural thrombus -Discussed with Dr. Jacinto Halim, cardiologist, via phone. Patient was on coumadin for appropriate amount of time. While he was taking it, his INR was normally supratherapeutic. Patient does have some noncompliance with medications as well  Peter Cole 66 y.o.   Chief Complaint  Patient presents with  . Follow-up    Hospital visit 08/22/17,  per pt feels better    HISTORY OF PRESENT ILLNESS: This is a 66 y.o. male here for follow up of recent hospital stay for CVA; discharge note reviewed. Pt doing well without any complaints. Feels better.  HPI   Prior to Admission medications   Medication Sig Start Date End Date Taking? Authorizing Provider  aspirin EC 81 MG tablet Take 81 mg by mouth daily.   Yes [provider]  atorvastatin (LIPITOR) 40 MG tablet Take 1 tablet (40 mg total) by mouth daily at 6 PM. 08/25/17  Yes Mikhail, Nita Sells, DO  clopidogrel (PLAVIX) 75 MG tablet Take 75 mg by mouth daily.  Yes [provider]  dorzolamide (TRUSOPT) 2 % ophthalmic solution Place 1 drop into the left eye 2 (two) times  daily.   Yes [provider]  Insulin Detemir (LEVEMIR FLEXPEN) 100 UNIT/ML Pen Inject 30 Units into the skin every morning. And pen needles 1/day 07/28/17  Yes Romero Belling, MD  pantoprazole (PROTONIX) 40 MG tablet Take 40 mg by mouth daily.   Yes [provider]  polyvinyl alcohol (LIQUIFILM TEARS) 1.4 % ophthalmic solution Place 1 drop into both eyes as needed for dry eyes.   Yes [provider]  prednisoLONE acetate (PRED FORTE) 1 % ophthalmic suspension Place 1 drop into the left eye 3 (three) times daily.   Yes [provider]  ramipril (ALTACE) 5 MG capsule Take 1 capsule (5 mg total) by mouth daily. 08/07/17  Yes Wallis Bamberg, PA-C  spironolactone (ALDACTONE) 25 MG tablet Take 1 tablet (25 mg total) by mouth daily. 08/07/17  Yes Wallis Bamberg, PA-C  timolol (BETIMOL) 0.5 % ophthalmic solution Place 1 drop into both eyes 2 (two) times daily.    [provider]    No Known Allergies  Patient Active Problem List   Diagnosis Date Noted  . Diabetic polyneuropathy associated with type 2 diabetes mellitus (HCC) 08/22/2017  . Other symptoms and signs involving the nervous system 08/22/2017  . Dysarthria 08/22/2017  . Acute embolic stroke (HCC) 08/22/2017  . Acute ischemic stroke (HCC) 08/22/2017  . Diabetes (HCC) 07/30/2017  . Ischemic cardiomyopathy 07/06/2017  . 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 Myocardial infarction (HCC) 05/12/2017  . Fungal infection of eye 05/12/2017  . History of myocardial infarction 05/12/2017  . Warfarin anticoagulation 05/12/2017  . LBBB (left bundle branch block) 05/12/2017    Past Medical History:  Diagnosis Date  . CAD (coronary artery disease)   . Diabetes mellitus without complication (HCC)   . Fungal infection of eye 05/12/2017  . Hyperlipidemia   . Ischemic cardiomyopathy 07/06/2017   06/16/2017. [facility].  Yates Decamp, MD.  Coronary artery bypass  graft.  CKD stage 3 due to type 2 diabetes mellitus.  Hyperlipidemia, mixed.  . Ischemic cardiomyopathy   . LBBB (left bundle branch block) 05/12/2017  . Myocardial infarction (HCC)   . 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 Warfarin anticoagulation 05/12/2017    Past Surgical History:  Procedure Laterality Date  . CORONARY ARTERY BYPASS GRAFT    . EYE SURGERY    . LEFT TOE AMPUTATION Left   . TEE WITHOUT CARDIOVERSION N/A 08/25/2017   Procedure: TRANSESOPHAGEAL ECHOCARDIOGRAM (TEE);  Surgeon: Yates Decamp, MD;  Location: [facility] ENDOSCOPY;  Service: Cardiovascular;  Laterality: N/A;    Social History   Social History  . Marital status: Single    Spouse name: N/A  . Number of children: N/A  . Years of education: N/A   Occupational History  . Not on file.   Social History Main Topics  . Smoking status: Former Games developer  . Smokeless tobacco: Never Used     Comment: 40 years ago  . Alcohol use No  . Drug use: No  . Sexual activity: Not on file   Other Topics Concern  . Not on file   Social History Narrative  . No narrative on file    Family History  Problem Relation Age of Onset  . Heart failure Mother   . Diabetes Mother   .  Heart attack Father      Review of Systems  Constitutional: Negative.  Negative for chills and fever.  HENT: Negative.  Negative for congestion, nosebleeds and sore throat.   Eyes: Negative.  Negative for discharge and redness.       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  Respiratory: Negative.  Negative for cough and shortness of breath.   Cardiovascular: Negative.  Negative for chest pain, palpitations, claudication and leg swelling.  Gastrointestinal: Negative.  Negative for abdominal pain, diarrhea, nausea and vomiting.  Genitourinary: Negative.  Negative for dysuria and hematuria.  Musculoskeletal: Negative for back pain and neck pain.  Skin: Negative.  Negative for rash.  Neurological: Negative.  Negative for  dizziness, sensory change, focal weakness, seizures, loss of consciousness and headaches.  Endo/Heme/Allergies: Negative.   All other systems reviewed and are negative.  Vitals:   08/30/17 1335  BP: 104/62  Pulse: 86  Resp: 16  Temp: 97.7 F (36.5 C)  SpO2: 99%     Physical Exam  Constitutional: He is oriented to person, place, and time. He appears well-developed and well-nourished.  HENT:  Head: Normocephalic and atraumatic.  Nose: Nose normal.  Mouth/Throat: Oropharynx is clear and moist.  Eyes: Conjunctivae and EOM are normal.  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  Neck: Normal range of motion. Neck supple. No JVD present.  Cardiovascular: Normal rate, regular rhythm and normal heart sounds.   Still wearing chest vest with defibrillator/ext PM  Pulmonary/Chest: Effort normal and breath sounds normal.  Abdominal: Soft. Bowel sounds are normal. There is no tenderness.  Musculoskeletal: Normal range of motion. He exhibits no edema or tenderness.  Lymphadenopathy:    He has no cervical adenopathy.  Neurological: He is alert and oriented to person, place, and time. No sensory deficit. He exhibits normal muscle tone. Coordination normal.  Skin: Skin is warm and dry. Capillary refill takes less than 2 seconds. No rash noted.  Psychiatric: He has a normal mood and affect. His behavior is normal.  Vitals reviewed.  A total of 30 minutes was spent in the room with the patient, greater than 50% of which was in counseling/coordination of care regarding chronic medical conditions, treatment, prognosis, follow up visits, medications, and lifestyle choices.  Pertinent labs & imaging results that were available during my care of the patient were reviewed by me and considered in my medical decision making.   ASSESSMENT & PLAN: Peter Loganrnesto was seen today for follow-up.  Diagnoses and all orders for this visit:  Hospital discharge follow-up  Ischemic cardiomyopathy  History of recent stroke  Stage 3  chronic kidney disease due to type 2 diabetes mellitus (HCC)  Type 2 diabetes mellitus with stage 3 chronic kidney disease, without long-term current use of insulin (HCC)   Pt in stable condition. No new medical issues identified. Patient Instructions    Continue present care. Instructions as per Hospital d/c. Follow up in 3 months.   IF you received an x-ray today, you will receive [facility]. Please contact [facility] at [phone] with questions or concerns regarding your invoice.   IF you received labwork today, you will receive an invoice from [facility]. Please contact [facility] at [phone] with questions or concerns regarding your invoice.   Our billing staff will not be able to assist you with questions regarding bills from these companies.  You will be contacted with the lab results as soon as they are available. The fastest way to get your results is to activate your My  Chart account. Instructions are located on the last page of this paperwork. If you have not heard from [place] regarding the results in 2 weeks, please contact this office.         Edwina Barth, MD [facility]

## 2017-09-07 ENCOUNTER — Encounter: Payer: Self-pay | Admitting: Endocrinology

## 2017-09-07 ENCOUNTER — Ambulatory Visit (INDEPENDENT_AMBULATORY_CARE_PROVIDER_SITE_OTHER): Payer: Medicare Other | Admitting: Endocrinology

## 2017-09-07 VITALS — BP 134/82 | HR 78 | Ht 62.0 in | Wt 176.0 lb

## 2017-09-07 DIAGNOSIS — N183 Chronic kidney disease, stage 3 unspecified: Secondary | ICD-10-CM

## 2017-09-07 DIAGNOSIS — I255 Ischemic cardiomyopathy: Secondary | ICD-10-CM | POA: Diagnosis not present

## 2017-09-07 DIAGNOSIS — E1122 Type 2 diabetes mellitus with diabetic chronic kidney disease: Secondary | ICD-10-CM

## 2017-09-07 NOTE — Progress Notes (Signed)
Subjective:    Patient ID: Peter Cole, male    DOB: 1951/09/09, 66 y.o.   MRN: [id number]  HPI Pt returns for f/u of diabetes mellitus: DM type: Insulin-requiring type 2 Dx'ed: 2015 Complications: CVA, polyneuropathy, renal failure, and toe amputation Therapy: insulin since.  DKA: never Severe hypoglycemia: never.  Pancreatitis: never Pancreatic imaging: never Other: he is on qd insulin, at least for now Interval history: He was recently in the hospital with CVA.  He presented with inability to talk.  He says he is only slightly better.  no cbg record, but states cbg's 80-180.  It is in general higher as the day goes on Past Medical History:  Diagnosis Date  . CAD (coronary artery disease)   . Diabetes mellitus without complication (HCC)   . Fungal infection of eye 05/12/2017  . Hyperlipidemia   . Ischemic cardiomyopathy 07/06/2017   06/16/2017. [facility].  Yates Decamp, MD.  Coronary artery bypass graft.  CKD stage 3 due to type 2 diabetes mellitus.  Hyperlipidemia, mixed.  . Ischemic cardiomyopathy   . LBBB (left bundle branch block) 05/12/2017  . Myocardial infarction (HCC)   . 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 Warfarin anticoagulation 05/12/2017    Past Surgical History:  Procedure Laterality Date  . CORONARY ARTERY BYPASS GRAFT    . EYE SURGERY    . LEFT TOE AMPUTATION Left   . TEE WITHOUT CARDIOVERSION N/A 08/25/2017   Procedure: TRANSESOPHAGEAL ECHOCARDIOGRAM (TEE);  Surgeon: Yates Decamp, MD;  Location: [facility] ENDOSCOPY;  Service: Cardiovascular;  Laterality: N/A;    Social History   Social History  . Marital status: Single    Spouse name: N/A  . Number of children: N/A  . Years of education: N/A   Occupational History  . Not on file.   Social History Main Topics  . Smoking status: Former Games developer  . Smokeless tobacco: Never Used     Comment: 40 years ago  . Alcohol use No  . Drug use: No  . Sexual  activity: Not on file   Other Topics Concern  . Not on file   Social History Narrative  . No narrative on file    Current Outpatient Prescriptions on File Prior to Visit  Medication Sig Dispense Refill  . aspirin EC 81 MG tablet Take 81 mg by mouth daily.    Marland Kitchen atorvastatin (LIPITOR) 40 MG tablet Take 1 tablet (40 mg total) by mouth daily at 6 PM. 30 tablet 0  . clopidogrel (PLAVIX) 75 MG tablet Take 75 mg by mouth daily.    . dorzolamide (TRUSOPT) 2 % ophthalmic solution Place 1 drop into the left eye 2 (two) times daily.    . Insulin Detemir (LEVEMIR FLEXPEN) 100 UNIT/ML Pen Inject 30 Units into the skin every morning. And pen needles 1/day 15 mL 11  . pantoprazole (PROTONIX) 40 MG tablet Take 40 mg by mouth daily.    . polyvinyl alcohol (LIQUIFILM TEARS) 1.4 % ophthalmic solution Place 1 drop into both eyes as needed for dry eyes.    . prednisoLONE acetate (PRED FORTE) 1 % ophthalmic suspension Place 1 drop into the left eye 3 (three) times daily.    . ramipril (ALTACE) 5 MG capsule Take 1 capsule (5 mg total) by mouth daily. 90 capsule 1  . spironolactone (ALDACTONE) 25 MG tablet Take 1 tablet (25 mg total) by mouth daily. 90 tablet 1  . timolol (BETIMOL) 0.5 %  ophthalmic solution Place 1 drop into both eyes 2 (two) times daily.     No current facility-administered medications on file prior to visit.     No Known Allergies  Family History  Problem Relation Age of Onset  . Heart failure Mother   . Diabetes Mother   . Heart attack Father     BP 134/82   Pulse 78   Ht 5\' 2"  (1.575 m)   Wt 176 lb (79.8 kg)   SpO2 97%   BMI 32.19 kg/m    Review of Systems He denies hypoglycemia    Objective:   Physical Exam VITAL SIGNS:  See vs page GENERAL: no distress Pulses: dorsalis pedis intact bilat.   MSK: no deformity of the feet, except the left great toe is absent.   CV: trace bilat leg edema Skin:  no ulcer on the feet.  normal color and temp on the feet. Neuro:  sensation is intact to touch on the feet, but decreased from normal.    Lab Results  Component Value Date   HGBA1C 8.1 (H) 08/23/2017      Assessment & Plan:  Insulin-requiring type 2 DM, with polyneuropathy.  Glycemic control is improved Renal failure: he may need a faster-acting qd insulin. CVA: he is not a candidate for aggressive glycemic control.  Patient Instructions  check your blood sugar twice a day.  vary the time of day when you check, between before the 3 meals, and at bedtime.  also check if you have symptoms of your blood sugar being too high or too low.  please keep a record of the readings and bring it to your next appointment here (or you can bring the meter itself).  You can write it on any piece of paper.  please call us sooner if your blood sugar goes below 70, or if you have a lot of readings over 200. Please continue the same levemir. Please come back for a follow-up appointment in 3 months.

## 2017-09-07 NOTE — Patient Instructions (Addendum)
check your blood sugar twice a day.  vary the time of day when you check, between before the 3 meals, and at bedtime.  also check if you have symptoms of your blood sugar being too high or too low.  please keep a record of the readings and bring it to your next appointment here (or you can bring the meter itself).  You can write it on any piece of paper.  please call us sooner if your blood sugar goes below 70, or if you have a lot of readings over 200. Please continue the same levemir. Please come back for a follow-up appointment in 3 months.

## 2017-09-14 ENCOUNTER — Telehealth: Payer: Self-pay | Admitting: Family Medicine

## 2017-09-14 ENCOUNTER — Telehealth: Payer: Self-pay | Admitting: General Practice

## 2017-09-14 NOTE — Telephone Encounter (Signed)
Copied from CRM #[id number]. Topic: Inquiry >> Sep 14, 2017 10:10 AM Anice Paganini I, NT wrote: Reason for CRM:Pt call for his Levemir Flex pen 100 unit M1  Pt is out of his Med Thank >> Sep 14, 2017 12:17 PM Louie Bun, Rosey Bath D wrote: Patient also needs refill on his Reli On True Metrix sent along with his insulin Detemir to [facility] on [address]

## 2017-09-14 NOTE — Telephone Encounter (Signed)
Copied from CRM #[id number]. Topic: Inquiry >> Sep 14, 2017 10:10 AM Peter Cole I, NT wrote: Reason for CRM:Pt call for his Levemir Flex pen 100 unit M1  Pt is out of his Med Thank

## 2017-09-15 ENCOUNTER — Other Ambulatory Visit: Payer: Self-pay | Admitting: Physician Assistant

## 2017-09-15 DIAGNOSIS — E118 Type 2 diabetes mellitus with unspecified complications: Secondary | ICD-10-CM

## 2017-09-15 NOTE — Telephone Encounter (Signed)
Interpreter service used, Byrd Hesselbach ID# [phone] called pt, and pt allowed son to speak on his behalf. Explained to pt's son that triage nurse called in refill for Levemir pen and needles to [facility].Pt made aware Pt also notified that prescription for Reli On True Metrix would be called in at a later time. Explained to the pt per pharmacist that a cheaper option to look into for a meter and strips was available over the counter for $9 each, but needed to have approval of insurance to make sure it was up to standard with the requirements. Pt's son verbalized understanding and stated pt did not have any further questions at this time.

## 2017-09-18 NOTE — Telephone Encounter (Signed)
Already picked up.

## 2017-09-21 ENCOUNTER — Encounter: Payer: Self-pay | Admitting: Endocrinology

## 2017-09-21 DIAGNOSIS — N183 Chronic kidney disease, stage 3 (moderate): Secondary | ICD-10-CM | POA: Diagnosis not present

## 2017-09-21 DIAGNOSIS — D631 Anemia in chronic kidney disease: Secondary | ICD-10-CM | POA: Diagnosis not present

## 2017-09-21 DIAGNOSIS — E1122 Type 2 diabetes mellitus with diabetic chronic kidney disease: Secondary | ICD-10-CM | POA: Diagnosis not present

## 2017-09-21 DIAGNOSIS — I639 Cerebral infarction, unspecified: Secondary | ICD-10-CM | POA: Diagnosis not present

## 2017-09-21 DIAGNOSIS — N189 Chronic kidney disease, unspecified: Secondary | ICD-10-CM | POA: Diagnosis not present

## 2017-09-21 DIAGNOSIS — I129 Hypertensive chronic kidney disease with stage 1 through stage 4 chronic kidney disease, or unspecified chronic kidney disease: Secondary | ICD-10-CM | POA: Diagnosis not present

## 2017-09-21 DIAGNOSIS — I255 Ischemic cardiomyopathy: Secondary | ICD-10-CM | POA: Diagnosis not present

## 2017-09-25 ENCOUNTER — Telehealth: Payer: Self-pay | Admitting: General Practice

## 2017-09-25 ENCOUNTER — Other Ambulatory Visit: Payer: Self-pay

## 2017-09-25 ENCOUNTER — Telehealth: Payer: Self-pay | Admitting: *Deleted

## 2017-09-25 MED ORDER — ATORVASTATIN CALCIUM 40 MG PO TABS: 40.0000 mg | ORAL_TABLET | Freq: Every day | ORAL | 1 refills | Status: DC

## 2017-09-25 NOTE — Telephone Encounter (Signed)
Copied from CRM [id number]. Topic: Quick Communication - See Telephone Encounter >> Sep 25, 2017  2:28 PM Lilia Pro wrote: CRM for notification. See Telephone encounter for: Pt needs refill for atorvastatin (LIPITOR) 40 MG tablet [[id number]]   Pharm: [facility] [address] ([address] [place] - [address] [phone]  74/[birth year].

## 2017-09-27 ENCOUNTER — Telehealth: Payer: Self-pay | Admitting: *Deleted

## 2017-09-27 MED ORDER — CARVEDILOL 25 MG PO TABS: 25.0000 mg | ORAL_TABLET | Freq: Two times a day (BID) | ORAL | 3 refills | Status: DC

## 2017-09-27 NOTE — Telephone Encounter (Signed)
Spoke to son. Instructed that pt may stop LifeVest per Dr. Elberta Fortis. Also advised to restart Carvedilol 25 mg BID.  Rx sent to pharmacy. Son aware I will call him within next several weeks to arrange follow up with Dr. Elberta Fortis.  (he is at work and can't talk right now) Son verbalized understanding and agreeable to plan.

## 2017-10-04 ENCOUNTER — Ambulatory Visit: Payer: Medicare Other | Admitting: Physician Assistant

## 2017-10-06 ENCOUNTER — Encounter: Payer: Self-pay | Admitting: Physician Assistant

## 2017-10-06 ENCOUNTER — Ambulatory Visit (INDEPENDENT_AMBULATORY_CARE_PROVIDER_SITE_OTHER): Payer: Medicare Other | Admitting: Physician Assistant

## 2017-10-06 VITALS — BP 114/74 | HR 75 | Temp 97.9°F | Resp 16 | Ht 62.0 in | Wt 174.8 lb

## 2017-10-06 DIAGNOSIS — E1142 Type 2 diabetes mellitus with diabetic polyneuropathy: Secondary | ICD-10-CM

## 2017-10-06 DIAGNOSIS — Z8673 Personal history of transient ischemic attack (TIA), and cerebral infarction without residual deficits: Secondary | ICD-10-CM | POA: Diagnosis not present

## 2017-10-06 NOTE — Patient Instructions (Addendum)
Your lancets and test strips are at your pharmacy.  Keep your follow-up appointment with Dr. Mitchel Honour.  Continue checking blood sugars daily.   Thank you for coming in today. I hope you feel we met your needs.  Feel free to call PCP if you have any questions or further requests.  Please consider signing up for MyChart if you do not already have it, as this is a great way to communicate with me.  Best,  Whitney McVey, PA-C   IF you received an x-ray today, you will receive an invoice from [facility]. Please contact [facility] at [phone] with questions or concerns regarding your invoice.   IF you received labwork today, you will receive an invoice from [facility]. Please contact [facility] at [phone] with questions or concerns regarding your invoice.   Our billing staff will not be able to assist you with questions regarding bills from these companies.  You will be contacted with the lab results as soon as they are available. The fastest way to get your results is to activate your My Chart account. Instructions are located on the last page of this paperwork. If you have not heard from [place] regarding the results in 2 weeks, please contact this office.

## 2017-10-06 NOTE — Progress Notes (Signed)
Peter Cole  MRN: [id number] DOB: [birth year]  PCP: Patient, No Pcp Per  Subjective:  Pt is a 66 year old male PMH CVA, polyneuropathy, renal failure, and toe amputation presents to clinic for f/u DM. He received a call from our office reminding him of appt.  S/p CVA 08/22/2017 He is feeling well today. Reports con't improvement since hospital discharge. Speech is 100% better. Denies muscle weakness, n/t, facial drooping, drooling.   D/M - needs refill of lancets and test strip. Endocrinology for DM f/u, Next OV in Feb.   Was advised by cardiologist to restart Carvedilol 25 mg BID  Care team: Cardiology: Dr. Elberta Fortis - needs f/u appt in the next few weeks.  pt may stop LifeVest per Dr. Elberta Fortis. He is still wearing this.   Review of Systems  Respiratory: Negative for cough, chest tightness and shortness of breath.   Cardiovascular: Negative for chest pain and palpitations.  Endocrine: Negative for polydipsia, polyphagia and polyuria.    Patient Active Problem List   Diagnosis Date Noted  . History of recent stroke 08/30/2017  . Diabetic polyneuropathy associated with type 2 diabetes mellitus (HCC) 08/22/2017  . Other symptoms and signs involving the nervous system 08/22/2017  . Dysarthria 08/22/2017  . Acute embolic stroke (HCC) 08/22/2017  . Acute ischemic stroke (HCC) 08/22/2017  . Type 2 diabetes mellitus with stage 3 chronic kidney disease, without long-term current use of insulin (HCC) 07/30/2017  . Ischemic cardiomyopathy 07/06/2017  . 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 Myocardial infarction (HCC) 05/12/2017  . Fungal infection of eye 05/12/2017  . History of myocardial infarction 05/12/2017  . Warfarin anticoagulation 05/12/2017  . LBBB (left bundle branch block) 05/12/2017    Current Outpatient Medications on File Prior to Visit  Medication Sig Dispense Refill  . aspirin EC 81 MG tablet Take 81 mg by mouth daily.     Marland Kitchen atorvastatin (LIPITOR) 40 MG tablet Take 1 tablet (40 mg total) daily at 6 PM by mouth. 30 tablet 1  . carvedilol (COREG) 25 MG tablet Take 1 tablet (25 mg total) by mouth 2 (two) times daily. 180 tablet 3  . clopidogrel (PLAVIX) 75 MG tablet Take 75 mg by mouth daily.    . dorzolamide (TRUSOPT) 2 % ophthalmic solution Place 1 drop into the left eye 2 (two) times daily.    . Insulin Detemir (LEVEMIR FLEXPEN) 100 UNIT/ML Pen Inject 30 Units into the skin every morning. And pen needles 1/day 15 mL 11  . polyvinyl alcohol (LIQUIFILM TEARS) 1.4 % ophthalmic solution Place 1 drop into both eyes as needed for dry eyes.    . prednisoLONE acetate (PRED FORTE) 1 % ophthalmic suspension Place 1 drop into the left eye 3 (three) times daily.    . ramipril (ALTACE) 5 MG capsule Take 1 capsule (5 mg total) by mouth daily. 90 capsule 1  . spironolactone (ALDACTONE) 25 MG tablet Take 1 tablet (25 mg total) by mouth daily. 90 tablet 1  . timolol (BETIMOL) 0.5 % ophthalmic solution Place 1 drop into both eyes 2 (two) times daily.    . TRUEPLUS PEN NEEDLES 31G X 8 MM MISC USE  ONCE DAILY 100 each 1  . pantoprazole (PROTONIX) 40 MG tablet Take 40 mg by mouth daily.     No current facility-administered medications on file prior to visit.     No Known Allergies   Objective:  BP 114/74   Pulse 75  Temp 97.9 F (36.6 C) (Oral)   Resp 16   Ht 5\' 2"  (1.575 m)   Wt 174 lb 12.8 oz (79.3 kg)   SpO2 98%   BMI 31.97 kg/m   Physical Exam  Constitutional: He is oriented to person, place, and time and well-developed, well-nourished, and in no distress. No distress.  Cardiovascular: Normal rate, regular rhythm and normal heart sounds.  Pulmonary/Chest:  Still wearing LifeVest.   Neurological: He is alert and oriented to person, place, and time. GCS score is 15.  Skin: Skin is warm and dry.  Psychiatric: Mood, memory, affect and judgment normal.  Vitals reviewed.   Assessment and Plan :  1. Diabetic  polyneuropathy associated with type 2 diabetes mellitus (HCC) 2. History of recent stroke - Pt presents for f/u DM, needs refill of lancets and glucose test strips. It appears lancets have been called in a few times in the past few months. Called and spoke with pharmacy and they never received request. Refilled lancets and test strips over the phone. Last A1C check 10/17, 8.1%. F/u in 2 months for Dr. Alvy BimlerSagardia. F/u with Cardiology: Dr. Elberta Fortisamnitz in the next few weeks.   Marco CollieWhitney Emerly Prak, PA-C  [facility] 10/06/2017 9:20 AM

## 2017-10-12 ENCOUNTER — Ambulatory Visit: Payer: Medicare Other | Admitting: Urgent Care

## 2017-10-12 NOTE — Telephone Encounter (Signed)
Forwarding to scheduler to schedule pt

## 2017-10-18 ENCOUNTER — Telehealth: Payer: Self-pay | Admitting: General Practice

## 2017-10-18 NOTE — Telephone Encounter (Signed)
Copied from CRM [id number]. Topic: Quick Communication - Rx Refill/Question >> Oct 18, 2017 11:59 AM Landry Mellow wrote: Has the patient contacted their pharmacy? Yes.     (Agent: If no, request that the patient contact the pharmacy for the refill.)   Preferred Pharmacy (with phone number or street name): [facility] on [address]  - pt would like refill on atorvastatin (LIPITOR) 40 MG tablet, clopidogrel (PLAVIX) 75 MG tablet, and spironolactone (ALDACTONE) 25 MG tablet. Pt was told by the pharmacy to call us.    Agent: Please be advised that RX refills may take up to 3 business days. We ask that you follow-up with your pharmacy.

## 2017-10-19 ENCOUNTER — Other Ambulatory Visit: Payer: Self-pay

## 2017-10-19 MED ORDER — SPIRONOLACTONE 25 MG PO TABS: 25.0000 mg | ORAL_TABLET | Freq: Every day | ORAL | 1 refills | Status: DC

## 2017-10-19 MED ORDER — CLOPIDOGREL BISULFATE 75 MG PO TABS: 75.0000 mg | ORAL_TABLET | Freq: Every day | ORAL | 1 refills | Status: DC

## 2017-10-19 MED ORDER — ATORVASTATIN CALCIUM 40 MG PO TABS: 40.0000 mg | ORAL_TABLET | Freq: Every day | ORAL | 1 refills | Status: DC

## 2017-11-08 NOTE — Telephone Encounter (Signed)
error 

## 2017-11-09 ENCOUNTER — Other Ambulatory Visit: Payer: Self-pay | Admitting: *Deleted

## 2017-11-09 ENCOUNTER — Telehealth: Payer: Self-pay | Admitting: Physician Assistant

## 2017-11-09 MED ORDER — RAMIPRIL 5 MG PO CAPS: 5.0000 mg | ORAL_CAPSULE | Freq: Every day | ORAL | 0 refills | Status: DC

## 2017-11-09 NOTE — Telephone Encounter (Signed)
Refill request

## 2017-11-09 NOTE — Telephone Encounter (Signed)
Copied from CRM [id number]. Topic: Quick Communication - See Telephone Encounter >> Nov 09, 2017  2:06 PM Terisa Starr wrote: CRM for notification. See Telephone encounter for:   11/09/17.  REFILL::  ramipril (ALTACE) 5 MG capsule dorzolamide (TRUSOPT) 2 % ophthalmic solution  [id number] - [place] ([place]), [place] - [address]

## 2017-11-13 ENCOUNTER — Other Ambulatory Visit: Payer: Self-pay | Admitting: *Deleted

## 2017-11-13 MED ORDER — DORZOLAMIDE HCL 2 % OP SOLN: 1.0000 [drp] | Freq: Two times a day (BID) | OPHTHALMIC | 0 refills | Status: DC

## 2017-11-13 MED ORDER — RAMIPRIL 5 MG PO CAPS: 5.0000 mg | ORAL_CAPSULE | Freq: Every day | ORAL | 0 refills | Status: DC

## 2017-11-13 NOTE — Telephone Encounter (Signed)
Medication refilled for Altace and Trusopt 2% with no refills until his appointment 12/01/2017 with Dr Alvy Bimler.

## 2017-11-14 ENCOUNTER — Telehealth: Payer: Self-pay | Admitting: Emergency Medicine

## 2017-11-14 ENCOUNTER — Ambulatory Visit: Payer: Medicare Other | Admitting: Cardiology

## 2017-11-14 NOTE — Telephone Encounter (Signed)
Copied from CRM [id number]. Topic: Quick Communication - See Telephone Encounter >> Nov 14, 2017  1:17 PM Cipriano Bunker wrote: CRM for notification. See Telephone encounter for:  Prescription refill needed for Trepnisolone eye drops and dorzolamide  Please call into   [facility] [address] ([address]), [place] - [address] [place] ([place]) [place] [address] Phone: [phone] Fax: [phone]   11/14/17.

## 2017-11-14 NOTE — Telephone Encounter (Signed)
Requested refill for Prednisolone eye drops, no refill or provider listed, and dorzolamide, which was already filled 11/13/16, last OV 10/06/17.

## 2017-11-16 NOTE — Telephone Encounter (Signed)
Done on 1/7

## 2017-11-23 ENCOUNTER — Ambulatory Visit (INDEPENDENT_AMBULATORY_CARE_PROVIDER_SITE_OTHER): Payer: Medicare Other | Admitting: Cardiology

## 2017-11-23 ENCOUNTER — Encounter: Payer: Self-pay | Admitting: Cardiology

## 2017-11-23 VITALS — BP 100/62 | HR 64 | Ht 62.0 in | Wt 175.0 lb

## 2017-11-23 DIAGNOSIS — E785 Hyperlipidemia, unspecified: Secondary | ICD-10-CM | POA: Diagnosis not present

## 2017-11-23 DIAGNOSIS — I255 Ischemic cardiomyopathy: Secondary | ICD-10-CM | POA: Diagnosis not present

## 2017-11-23 DIAGNOSIS — I10 Essential (primary) hypertension: Secondary | ICD-10-CM | POA: Diagnosis not present

## 2017-11-23 DIAGNOSIS — I25810 Atherosclerosis of coronary artery bypass graft(s) without angina pectoris: Secondary | ICD-10-CM

## 2017-11-23 MED ORDER — ATORVASTATIN CALCIUM 40 MG PO TABS: 40.0000 mg | ORAL_TABLET | Freq: Every day | ORAL | 10 refills | Status: DC

## 2017-11-23 NOTE — Patient Instructions (Signed)
Medication Instructions:  Your physician recommends that you continue on your current medications as directed. Please refer to the Current Medication list given to you today.  * If you need a refill on your cardiac medications before your next appointment, please call your pharmacy.   Labwork: None ordered  Testing/Procedures: None ordered  Follow-Up: No follow up is needed at this time with Dr. Camnitz.  He will see you on an as needed basis.   Thank you for choosing [facility]!!   Sherri Price, RN [phone]  Any Other Special Instructions Will Be Listed Below (If Applicable).        

## 2017-11-23 NOTE — Progress Notes (Signed)
Electrophysiology Office Note   Date:  11/23/2017   ID:  Peter Cole, DOB 19-Feb-1951, MRN [id number]  PCP:  Patient, No Pcp Per  Cardiologist:  Jacinto Halim Primary Electrophysiologist:  Jennefer Kopp [name], MD    Chief Complaint  Patient presents with  . Follow-up    Ischemic cardiomopathy/Left bundle branch block     History of Present Illness: Peter Cole is a 67 y.o. male who is being seen today for the evaluation of CHF at the request of No ref. provider found. Presenting today for electrophysiology evaluation. He has a history of hypertension, hyperlipidemia, stage III CKG, ischemic cardiomyopathy with an EF of 15-20%, coronary artery disease status post CABG, and type 2 diabetes on insulin. He had an MI June 2018 in [place]. He was found to have a mural thrombus at that time and was placed on Coumadin. 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  Today, denies symptoms of palpitations, chest pain, shortness of breath, orthopnea, PND, lower extremity edema, claudication, dizziness, presyncope, syncope, bleeding, or neurologic sequela. The patient is tolerating medications without difficulties.  He is felt much improved since going on his heart failure medications.  He has had less shortness of breath and fatigue.  He feels that his heart function has improved and he Peter Cole not need an ICD.   Past Medical History:  Diagnosis Date  . CAD (coronary artery disease)   . Diabetes mellitus without complication (HCC)   . Fungal infection of eye 05/12/2017  . Hyperlipidemia   . Ischemic cardiomyopathy 07/06/2017   06/16/2017. [facility].  Yates Decamp, MD.  Coronary artery bypass graft.  CKD stage 3 due to type 2 diabetes mellitus.  Hyperlipidemia, mixed.  . Ischemic cardiomyopathy   . LBBB (left bundle branch block) 05/12/2017  . Myocardial infarction (HCC)   . Stage 3 chronic kidney disease due to type 2 diabetes mellitus (HCC)  06/29/2017  . Warfarin anticoagulation 05/12/2017   Past Surgical History:  Procedure Laterality Date  . CORONARY ARTERY BYPASS GRAFT    . EYE SURGERY    . LEFT TOE AMPUTATION Left   . TEE WITHOUT CARDIOVERSION N/A 08/25/2017   Procedure: TRANSESOPHAGEAL ECHOCARDIOGRAM (TEE);  Surgeon: Yates Decamp, MD;  [facility] ENDOSCOPY;  Service: Cardiovascular;  Laterality: N/A;     Current Outpatient Medications  Medication Sig Dispense Refill  . aspirin EC 81 MG tablet Take 81 mg by mouth daily.    Marland Kitchen atorvastatin (LIPITOR) 40 MG tablet Take 1 tablet (40 mg total) by mouth daily at 6 PM. 30 tablet 1  . carvedilol (COREG) 25 MG tablet Take 1 tablet (25 mg total) by mouth 2 (two) times daily. 180 tablet 3  . clopidogrel (PLAVIX) 75 MG tablet Take 1 tablet (75 mg total) by mouth daily. 901 tablet 1  . dorzolamide (TRUSOPT) 2 % ophthalmic solution Place 1 drop into the left eye 2 (two) times daily. 10 mL 0  . Insulin Detemir (LEVEMIR FLEXPEN) 100 UNIT/ML Pen Inject 30 Units into the skin every morning. And pen needles 1/day 15 mL 11  . pantoprazole (PROTONIX) 40 MG tablet Take 40 mg by mouth daily.    . prednisoLONE acetate (PRED FORTE) 1 % ophthalmic suspension Place 1 drop into the left eye 3 (three) times daily.    . ramipril (ALTACE) 5 MG capsule Take 1 capsule (5 mg total) by mouth daily. 30 capsule 0  . spironolactone (ALDACTONE) 25 MG tablet Take  1 tablet (25 mg total) by mouth daily. 90 tablet 1  . TRUEPLUS PEN NEEDLES 31G X 8 MM MISC USE  ONCE DAILY 100 each 1   No current facility-administered medications for this visit.     Allergies:   Patient has no known allergies.   Social History:  The patient  reports that he has quit smoking. he has never used smokeless tobacco. He reports that he does not drink alcohol or use drugs.   Family History:  The patient's family history includes Diabetes in his mother; Heart attack in his father; Heart failure in his mother.   ROS:  Please see the  history of present illness.   Otherwise, review of systems is positive for pain, visual changes, cough.   All other systems are reviewed and negative.   PHYSICAL EXAM: VS:  BP 100/62   Pulse 64   Ht 5\' 2"  (1.575 m)   Wt 175 lb (79.4 kg)   BMI 32.01 kg/m  , BMI Body mass index is 32.01 kg/m. GEN: Well nourished, well developed, in no acute distress  HEENT: normal  Neck: no JVD, carotid bruits, or masses Cardiac: RRR; no murmurs, rubs, or gallops,no edema  Respiratory:  clear to auscultation bilaterally, normal work of breathing GI: soft, nontender, nondistended, + BS MS: no deformity or atrophy  Skin: warm and dry Neuro:  Strength and sensation are intact Psych: euthymic mood, full affect  EKG:  EKG is not ordered today. Personal review of the ekg ordered 08/22/17 shows SR, LBBB   Recent Labs: 08/22/2017: ALT 25 08/24/2017: BUN 34; Creatinine, Ser 1.37; Potassium 4.0; Sodium 136 08/25/2017: Hemoglobin 11.1; Platelets 342    Lipid Panel     Component Value Date/Time   CHOL 135 08/23/2017 0400   CHOL 147 08/07/2017 1039   TRIG 126 08/23/2017 0400   HDL 30 (L) 08/23/2017 0400   HDL 33 (L) 08/07/2017 1039   CHOLHDL 4.5 08/23/2017 0400   VLDL 25 08/23/2017 0400   LDLCALC 80 08/23/2017 0400   LDLCALC 70 08/07/2017 1039     Wt Readings from Last 3 Encounters:  11/23/17 175 lb (79.4 kg)  10/06/17 174 lb 12.8 oz (79.3 kg)  09/07/17 176 lb (79.8 kg)      Other studies Reviewed: Additional studies/ records that were reviewed today include: TTE 08/23/17  Review of the above records today demonstrates:  - Left ventricle: The cavity size was mildly dilated. Wall   thickness was normal. Systolic function was moderately to   severely reduced. The estimated ejection fraction was in the   range of 30% to 35%. There is akinesis of the anteroseptal,   anterior, and apical myocardium. Doppler parameters are   consistent with abnormal left ventricular relaxation (grade 1    diastolic dysfunction). - Left atrium: The atrium was mildly dilated.   ASSESSMENT AND PLAN:  1.  Ischemic cardiomyopathy: He has been on optimal medical therapy.  I would like to get him an echocardiogram to further determine what his ejection fraction is prior to implantation of an ICD now that he has been on optimal therapy for some time.  He is feeling improved and thus it is possible that his EF is greater than 35%.  Should his EF remains low, would plan for CRT-D implantation.  We Coral Soler call him back to schedule based on his echo.  2. Coronary artery disease status post CABG: Currently no chest pain.  Continue with current management.  3. Hypertension: Well-controlled today.  4. Hyperlipidemia: Continue atorvastatin  Current medicines are reviewed at length with the patient today.   The patient does not have concerns regarding his medicines.  The following changes were made today:  none  Labs/ tests ordered today include:  No orders of the defined types were placed in this encounter.    Disposition:   FU with Peter Cole pending TTE  Signed, Peter Cole [name], MD  11/23/2017 2:10 PM     [facility] [address] [place] [address] [phone] (office) [phone] (fax)

## 2017-12-01 ENCOUNTER — Ambulatory Visit (INDEPENDENT_AMBULATORY_CARE_PROVIDER_SITE_OTHER): Payer: Medicare Other | Admitting: Emergency Medicine

## 2017-12-01 ENCOUNTER — Encounter: Payer: Self-pay | Admitting: Emergency Medicine

## 2017-12-01 VITALS — BP 124/80 | HR 73 | Temp 97.9°F | Resp 17 | Ht 62.0 in | Wt 177.0 lb

## 2017-12-01 DIAGNOSIS — E1140 Type 2 diabetes mellitus with diabetic neuropathy, unspecified: Secondary | ICD-10-CM

## 2017-12-01 DIAGNOSIS — Z947 Corneal transplant status: Secondary | ICD-10-CM

## 2017-12-01 DIAGNOSIS — I255 Ischemic cardiomyopathy: Secondary | ICD-10-CM | POA: Diagnosis not present

## 2017-12-01 DIAGNOSIS — Z794 Long term (current) use of insulin: Secondary | ICD-10-CM | POA: Diagnosis not present

## 2017-12-01 DIAGNOSIS — I10 Essential (primary) hypertension: Secondary | ICD-10-CM | POA: Diagnosis not present

## 2017-12-01 DIAGNOSIS — Z1211 Encounter for screening for malignant neoplasm of colon: Secondary | ICD-10-CM

## 2017-12-01 DIAGNOSIS — E785 Hyperlipidemia, unspecified: Secondary | ICD-10-CM

## 2017-12-01 NOTE — Patient Instructions (Addendum)
   IF you received an x-ray today, you will receive an invoice from [facility]. Please contact [facility] at [phone] with questions or concerns regarding your invoice.   IF you received labwork today, you will receive an invoice from [facility]. Please contact [facility] at [phone] with questions or concerns regarding your invoice.   Our billing staff will not be able to assist you with questions regarding bills from these companies.  You will be contacted with the lab results as soon as they are available. The fastest way to get your results is to activate your My Chart account. Instructions are located on the last page of this paperwork. If you have not heard from us regarding the results in 2 weeks, please contact this office.     Diabetes mellitus y nutricin Diabetes Mellitus and Nutrition Si sufre de diabetes (diabetes mellitus), es muy importante tener hbitos alimenticios saludables debido a que sus niveles de azcar en la sangre (glucosa) se ven afectados en gran medida por lo que come y bebe. Comer alimentos saludables en las cantidades adecuadas, aproximadamente a la misma hora todos los das, lo ayudar a:  Controlar la glucemia.  Disminuir el riesgo de sufrir una enfermedad cardaca.  Mejorar la presin arterial.  Alcanzar o mantener un peso saludable.  Todas las personas que sufren de diabetes son diferentes y cada una tiene necesidades diferentes en cuanto a un plan de alimentacin. El mdico puede recomendarle que trabaje con un especialista en dietas y nutricin (nutricionista) para elaborar el mejor plan para usted. Su plan de alimentacin puede variar segn factores como:  Las caloras que necesita.  Los medicamentos que toma.  Su peso.  Sus niveles de glucemia, presin arterial y colesterol.  Su nivel de actividad.  Otras afecciones que tenga, como enfermedades cardacas o renales.  Cmo me afectan los carbohidratos? Los  carbohidratos afectan el nivel de glucemia ms que cualquier otro tipo de alimento. La ingesta de carbohidratos naturalmente aumenta la cantidad glucosa en la sangre. El recuento de carbohidratos es un mtodo destinado a llevar un registro de la cantidad de carbohidratos que se ingieren. El recuento de carbohidratos es importante para mantener la glucemia a un nivel saludable, en especial si utiliza insulina o toma determinados medicamentos por va oral para la diabetes. Es importante saber la cantidad de carbohidratos que se pueden ingerir en cada comida sin correr ningn riesgo. Esto es diferente en cada persona. El nutricionista puede ayudarlo a calcular la cantidad de carbohidratos que debe ingerir en cada comida y colacin. Los alimentos que contienen carbohidratos incluyen:  Pan, cereal, arroz, pasta y galletas.  Papas y maz.  Guisantes, frijoles y lentejas.  Leche y yogur.  Frutas y jugo.  Postres, como pasteles, galletitas, helado y caramelos.  Cmo me afecta el alcohol? El alcohol puede provocar disminuciones sbitas de la glucemia (hipoglucemia), en especial si utiliza insulina o toma determinados medicamentos por va oral para la diabetes. La hipoglucemia es una afeccin potencialmente mortal. Los sntomas de la hipoglucemia (somnolencia, mareos y confusin) son similares a los sntomas de haber consumido demasiado alcohol. Si el mdico afirma que el alcohol es seguro para usted, siga estas pautas:  Limite el consumo de alcohol a no ms de 1 medida por da si es mujer y no est embarazada, y a 2 medidas si es hombre. Una medida equivale a 12oz (355ml) de cerveza, 5oz (148ml) de vino o 1oz (44ml) de bebidas de alta graduacin alcohlica.  No beba con el estmago   vaco.  Mantngase hidratado con agua, gaseosas dietticas o t helado sin azcar.  Tenga en cuenta que las gaseosas comunes, los jugos y otros refrescos pueden contener mucha azcar y se deben contar como  carbohidratos.  Consejos para seguir este plan Leer las etiquetas de los alimentos  Comience por controlar el tamao de la porcin en la etiqueta. La cantidad de caloras, carbohidratos, grasas y otros nutrientes mencionados en la etiqueta se basan en una porcin del alimento. Muchos alimentos contienen ms de una porcin por envase.  Verifique la cantidad total de gramos (g) de carbohidratos totales en una porcin. Puede calcular la cantidad de porciones de carbohidratos al dividir el total de carbohidratos por 15. Por ejemplo, si un alimento posee un total de 30g de carbohidratos, equivale a 2 porciones de carbohidratos.  Verifique la cantidad de gramos (g) de grasas saturadas y grasas trans en una porcin. Escoja alimentos que no contengan grasa o que tengan un bajo contenido.  Controle la cantidad de miligramos (mg) de sodio en una porcin. La mayora de las personas deben limitar la ingesta de sodio total a menos de 2300mg por da.  Siempre consulte la informacin nutricional de los alimentos etiquetados como "con bajo contenido de grasa" o "sin grasa". Estos alimentos pueden ser ms altos en azcar agregada o en carbohidratos refinados y deben evitarse.  Hable con el nutricionista para identificar sus objetivos diarios en cuanto a los nutrientes mencionados en la etiqueta. De compras  Evite comprar alimentos procesados, enlatados o prehechos. Estos alimentos tienden a tener mayor cantidad de grasa, sodio y azcar agregada.  Compre en la zona exterior de la tienda de comestibles. Esta incluye frutas y vegetales frescos, granos a granel, carnes frescas y productos lcteos frescos. Coccin  Utilice mtodos de coccin a baja temperatura, como hornear, en lugar de mtodos de coccin a alta temperatura, como frer en abundante aceite.  Cocine con aceites saludables, como el aceite de oliva, canola o girasol.  Evite cocinar con manteca, crema o carnes con alto contenido de  grasa. Planificacin de las comidas  Consuma las comidas y las colaciones de forma regular, preferentemente a la misma hora todos los das. Evite pasar largos perodos de tiempo sin comer.  Consuma alimentos ricos en fibra, como frutas frescas, verduras, frijoles y cereales integrales. Consulte al nutricionista sobre cuntas porciones de carbohidratos puede consumir en cada comida.  Consuma entre 4 y 6 onzas de protenas magras por da, como carnes magras, pollo, pescado, huevos o tofu. 1 onza equivale a 1 onza de carne, pollo o pescado, 1 huevo, o 1/4 taza de tofu.  Coma algunos alimentos por da que contengan grasas saludables, como aguacates, frutos secos, semillas y pescado. Estilo de vida   Controle su nivel de glucemia con regularidad.  Haga ejercicio al menos 30minutos, 5das o ms por semana, o como se lo haya indicado el mdico.  Tome los medicamentos como se lo haya indicado el mdico.  No consuma ningn producto que contenga nicotina o tabaco, como cigarrillos y cigarrillos electrnicos. Si necesita ayuda para dejar de fumar, consulte al mdico.  Trabaje con un asesor o instructor en diabetes para identificar estrategias para controlar el estrs y cualquier desafo emocional y social. Cules son algunas de las preguntas que puedo hacerle a mi mdico?  Es necesario que me rena con un instructor en diabetes?  Es necesario que me rena con un nutricionista?  A qu nmero puedo llamar si tengo preguntas?  Cules son los mejores momentos para   controlar la glucemia? Dnde encontrar ms informacin:  Asociacin Americana de la Diabetes (American Diabetes Association): diabetes.org/food-and-fitness/food  Academia de Nutricin y Diettica (Academy of Nutrition and Dietetics): www.eatright.org/resources/health/diseases-and-conditions/diabetes  Instituto Nacional de la Diabetes y las Enfermedades Digestivas y Renales (National Institute of Diabetes and Digestive and  Kidney Diseases) (Institutos Nacionales de Salud, NIH): www.niddk.nih.gov/health-information/diabetes/overview/diet-eating-physical-activity Resumen  Un plan de alimentacin saludable lo ayudar a controlar la glucemia y mantener un estilo de vida saludable.  Trabajar con un especialista en dietas y nutricin (nutricionista) puede ayudarlo a elaborar el mejor plan de alimentacin para usted.  Tenga en cuenta que los carbohidratos y el alcohol tienen efectos inmediatos en sus niveles de glucemia. Es importante contar los carbohidratos y consumir alcohol con prudencia. Esta informacin no tiene como fin reemplazar el consejo del mdico. Asegrese de hacerle al mdico cualquier pregunta que tenga. Document Released: 01/31/2008 Document Revised: 02/13/2017 Document Reviewed: 02/13/2017 Elsevier Interactive Patient Education  2018 Elsevier Inc.  

## 2017-12-01 NOTE — Progress Notes (Signed)
Peter Cole 67 y.o.   Chief Complaint  Patient presents with  . Follow-up    HISTORY OF PRESENT ILLNESS: This is a 67 y.o. male here for follow up; last visit 08/30/17 shortly after Hospital stay due to CVA; doing well with no residual neuro deficits. Diabetic (Dr. Everardo All). Has no complaints.  HPI   Prior to Admission medications   Medication Sig Start Date End Date Taking? Authorizing Provider  aspirin EC 81 MG tablet Take 81 mg by mouth daily.   Yes [provider]  atorvastatin (LIPITOR) 40 MG tablet Take 1 tablet (40 mg total) by mouth daily at 6 PM. 11/23/17  Yes Camnitz, Andree Coss, MD  carvedilol (COREG) 25 MG tablet Take 1 tablet (25 mg total) by mouth 2 (two) times daily. 09/27/17  Yes Camnitz, Andree Coss, MD  clopidogrel (PLAVIX) 75 MG tablet Take 1 tablet (75 mg total) by mouth daily. 10/19/17  Yes Wallis Bamberg, PA-C  dorzolamide (TRUSOPT) 2 % ophthalmic solution Place 1 drop into the left eye 2 (two) times daily. 11/13/17  Yes Montserrat Shek, Eilleen Kempf, MD  Insulin Detemir (LEVEMIR FLEXPEN) 100 UNIT/ML Pen Inject 30 Units into the skin every morning. And pen needles 1/day 07/28/17  Yes Romero Belling, MD  pantoprazole (PROTONIX) 40 MG tablet Take 40 mg by mouth daily.   Yes [provider]  prednisoLONE acetate (PRED FORTE) 1 % ophthalmic suspension Place 1 drop into the left eye 3 (three) times daily.   Yes [provider]  ramipril (ALTACE) 5 MG capsule Take 1 capsule (5 mg total) by mouth daily. 11/13/17  Yes Remmy Riffe, Eilleen Kempf, MD  spironolactone (ALDACTONE) 25 MG tablet Take 1 tablet (25 mg total) by mouth daily. 10/19/17  Yes Wallis Bamberg, PA-C  TRUEPLUS PEN NEEDLES 31G X 8 MM MISC USE  ONCE DAILY 09/15/17  Yes Myles Lipps, MD    No Known Allergies  Patient Active Problem List   Diagnosis Date Noted  . History of recent stroke 08/30/2017  . Diabetic polyneuropathy associated with type 2 diabetes mellitus (HCC) 08/22/2017  . Other  symptoms and signs involving the nervous system 08/22/2017  . Dysarthria 08/22/2017  . Acute embolic stroke (HCC) 08/22/2017  . Acute ischemic stroke (HCC) 08/22/2017  . Type 2 diabetes mellitus with stage 3 chronic kidney disease, without long-term current use of insulin (HCC) 07/30/2017  . Ischemic cardiomyopathy 07/06/2017  . 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 Myocardial infarction (HCC) 05/12/2017  . Fungal infection of eye 05/12/2017  . History of myocardial infarction 05/12/2017  . Warfarin anticoagulation 05/12/2017  . LBBB (left bundle branch block) 05/12/2017    Past Medical History:  Diagnosis Date  . CAD (coronary artery disease)   . Diabetes mellitus without complication (HCC)   . Fungal infection of eye 05/12/2017  . Hyperlipidemia   . Ischemic cardiomyopathy 07/06/2017   06/16/2017. [facility].  Yates Decamp, MD.  Coronary artery bypass graft.  CKD stage 3 due to type 2 diabetes mellitus.  Hyperlipidemia, mixed.  . Ischemic cardiomyopathy   . LBBB (left bundle branch block) 05/12/2017  . Myocardial infarction (HCC)   . 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 Warfarin anticoagulation 05/12/2017    Past Surgical History:  Procedure Laterality Date  . CORONARY ARTERY BYPASS GRAFT    . EYE SURGERY    . LEFT TOE AMPUTATION Left   . TEE WITHOUT  CARDIOVERSION N/A 08/25/2017   Procedure: TRANSESOPHAGEAL ECHOCARDIOGRAM (TEE);  Surgeon: Yates Decamp, MD;  Location: [facility] ENDOSCOPY;  Service: Cardiovascular;  Laterality: N/A;    Social History   Socioeconomic History  . Marital status: Single    Spouse name: Not on file  . Number of children: Not on file  . Years of education: Not on file  . Highest education level: Not on file  Social Needs  . Financial resource strain: Not on file  . Food insecurity - worry: Not on file  . Food insecurity - inability: Not on file  .  Transportation needs - medical: Not on file  . Transportation needs - non-medical: Not on file  Occupational History  . Not on file  Tobacco Use  . Smoking status: Former Games developer  . Smokeless tobacco: Never Used  . Tobacco comment: 40 years ago  Substance and Sexual Activity  . Alcohol use: No  . Drug use: No  . Sexual activity: Not on file  Other Topics Concern  . Not on file  Social History Narrative  . Not on file    Family History  Problem Relation Age of Onset  . Heart failure Mother   . Diabetes Mother   . Heart attack Father      Review of Systems  Constitutional: Negative for chills, fever and weight loss.  HENT: Negative for congestion, hearing loss, nosebleeds and sore throat.   Eyes:       Loss of vision right eye.  Respiratory: Negative for cough and shortness of breath.   Cardiovascular: Negative for chest pain, palpitations and leg swelling.  Gastrointestinal: Negative for abdominal pain, diarrhea, nausea and vomiting.  Genitourinary: Negative.  Negative for dysuria and hematuria.  Musculoskeletal: Negative for back pain, myalgias and neck pain.  Skin: Negative for rash.  Neurological: Negative.  Negative for dizziness and headaches.  Endo/Heme/Allergies: Negative.   All other systems reviewed and are negative.   Vitals:   12/01/17 1029  BP: 124/80  Pulse: 73  Resp: 17  Temp: 97.9 F (36.6 C)  SpO2: 98%    Physical Exam  Constitutional: He is oriented to person, place, and time. He appears well-developed and well-nourished.  HENT:  Head: Normocephalic and atraumatic.  Nose: Nose normal.  Mouth/Throat: Oropharynx is clear and moist.  Eyes: Conjunctivae and EOM are normal.  unreactive right pupil  Neck: Normal range of motion. Neck supple.  Cardiovascular: Normal rate, regular rhythm and normal heart sounds.  Pulmonary/Chest: Effort normal and breath sounds normal.  Abdominal: Soft. There is no tenderness.  Musculoskeletal: Normal range of  motion.  Neurological: He is alert and oriented to person, place, and time. He displays normal reflexes. No sensory deficit. He exhibits normal muscle tone. Coordination normal.  Skin: Skin is warm and dry. Capillary refill takes less than 2 seconds.  Psychiatric: He has a normal mood and affect. His behavior is normal.  Vitals reviewed.    ASSESSMENT & PLAN: [place] was seen today for follow-up.  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 Please contact [facility] at [phone] with questions or concerns regarding your invoice.   IF you received labwork today, you will receive an invoice from [facility]. Please contact [facility] at [phone] with questions or concerns regarding your invoice.   Our billing staff will not be able to assist you with questions regarding bills from these companies.  You will be contacted with the lab results as soon as they are available. The fastest way to get your results is to activate your My Chart account. Instructions are located on the last page of this paperwork. If you have not heard from [place] regarding the results in 2 weeks, please contact this office.      Diabetes mellitus y nutricin Diabetes Mellitus and Nutrition Si sufre de diabetes (diabetes mellitus), es muy importante tener hbitos alimenticios saludables debido a que sus  niveles de Psychologist, counselling sangre (glucosa) se ven afectados en gran medida por lo que come y bebe. [place] alimentos saludables en las cantidades [place], aproximadamente a la [employer], [place] ayudar a:  Scientist, physiological glucemia.  Disminuir el riesgo de sufrir una enfermedad cardaca.  Mejorar la presin arterial.  Barista o mantener un peso saludable.  Todas las personas que sufren de diabetes son diferentes y cada una tiene necesidades diferentes en cuanto a un plan de alimentacin. El mdico puede recomendarle que trabaje con un especialista en dietas y nutricin (nutricionista) para Tax adviser plan para usted. Su plan de alimentacin puede variar segn factores como:  Las caloras que necesita.  Los medicamentos que toma.  Su peso.  Sus niveles de glucemia, presin arterial y colesterol.  Su nivel de [place].  Otras afecciones que tenga, como enfermedades cardacas o renales.  Cmo me afectan los carbohidratos? Los carbohidratos afectan el nivel de glucemia ms que cualquier otro tipo de alimento. La ingesta de carbohidratos naturalmente aumenta la cantidad glucosa en la sangre. El recuento de carbohidratos es un mtodo destinado a Midwife un registro de la cantidad de carbohidratos que se ingieren. El recuento de carbohidratos es importante para Pharmacologist la glucemia a un nivel saludable, en especial si utiliza insulina o toma determinados medicamentos por va oral para la diabetes. Es importante saber la cantidad de carbohidratos que se pueden ingerir en cada comida sin correr Surveyor, minerals. Esto es Government social research officer. El nutricionista puede ayudarlo a calcular la cantidad de carbohidratos que debe ingerir en cada comida y colacin. Los alimentos que contienen carbohidratos incluyen:  Pan, cereal, arroz, pasta y galletas.  Papas y maz.  Guisantes, frijoles y lentejas.  Leche y Dentist.  Nils Pyle y [place].  Postres, como pasteles, galletitas, helado y  caramelos.  Cmo me afecta el alcohol? El alcohol puede provocar disminuciones sbitas de la glucemia (hipoglucemia), en especial si utiliza insulina o toma determinados medicamentos por va oral para la diabetes. La hipoglucemia es una afeccin potencialmente mortal. Los sntomas de la hipoglucemia (somnolencia, mareos y confusin) son similares a los sntomas de haber consumido demasiado alcohol. Si el mdico afirma que el alcohol es seguro para usted, siga estas pautas:  Limite el consumo de alcohol a no ms de 1 medida por da si es mujer y no est Orthoptist, y a 2 medidas si es hombre. Una medida equivale a 12oz (  ) de cerveza, 5oz ( ) de vino o 1oz (18ml) de bebidas de alta graduacin alcohlica.  No beba con el estmago vaco.  Mantngase hidratado con agua, gaseosas dietticas o t helado sin azcar.  Tenga en cuenta que las gaseosas comunes, los jugos y otros refrescos pueden contener mucha azcar y se deben contar como carbohidratos.  Consejos para seguir Consulting civil engineer las etiquetas de los alimentos  Comience por controlar el tamao de la porcin en la etiqueta. La cantidad de caloras, carbohidratos, grasas y otros nutrientes mencionados en la etiqueta se basan en una porcin del alimento. Muchos alimentos contienen ms de una porcin por envase.  Verifique la cantidad total de gramos (g) de carbohidratos totales en una porcin. Puede calcular la cantidad de porciones de carbohidratos al dividir el total de carbohidratos por 15. Por ejemplo, si un alimento posee un total de 30g de carbohidratos, equivale a 2 porciones de carbohidratos.  Verifique la cantidad de gramos (g) de grasas saturadas y grasas trans en una porcin. Escoja alimentos que no contengan grasa o que tengan un bajo contenido.  Controle la cantidad de miligramos (mg) de sodio en una porcin. La mayora de las personas deben limitar la ingesta de sodio total a menos de 2300mg  por Futures trader.  Siempre  consulte la informacin nutricional de los alimentos etiquetados como "con bajo contenido de grasa" o "sin grasa". Estos alimentos pueden ser ms altos en azcar agregada o en carbohidratos refinados y deben evitarse.  Hable con el nutricionista para identificar sus objetivos diarios en cuanto a los nutrientes mencionados en la etiqueta. De compras  Evite comprar alimentos procesados, enlatados o prehechos. Estos alimentos tienden a Civil Service fast streamer cantidad de [place], sodio y azcar agregada.  Compre en la zona exterior de la tienda de comestibles. Esta incluye frutas y Med[employer]onic, granos a granel, carnes frescas y productos lcteos frescos. Coccin  Utilice mtodos de coccin a baja temperatura, como hornear, en lugar de mtodos de coccin a alta temperatura, como frer en abundante aceite.  Cocine con aceites saludables, como el aceite de [place], canola o [place].  Evite cocinar con manteca, crema o carnes con alto contenido de grasa. Planificacin de las comidas  [employer] comidas y las colaciones de forma regular, preferentemente a la misma hora todos [place]. Evite pasar largos perodos de tiempo sin comer.  Consuma alimentos ricos en fibra, como frutas frescas, verduras, frijoles y cereales integrales. Consulte al nutricionista sobre cuntas porciones de carbohidratos puede consumir en cada comida.  Consuma entre 4 y 6 onzas de protenas magras por da, como carnes [place], pollo, pescado, [employer] o tofu. 1 onza equivale a 1 onza de carne, pollo o pescado, 1 huevo, o 1/4 taza de tofu.  Coma algunos alimentos por da que contengan grasas saludables, como aguacates, frutos secos, semillas y pescado. Estilo de vida   Controle su nivel de glucemia con regularidad.  Haga ejercicio al menos , 5das o ms por semana, o como se lo haya indicado el mdico.  Tome los [employer] se lo haya indicado el mdico.  No consuma ningn producto que contenga nicotina o tabaco,  como cigarrillos y Administrator, Civil Service. Si necesita ayuda para dejar de fumar, consulte al CIGNA con un asesor o instructor en diabetes para identificar estrategias para controlar el estrs y cualquier desafo emocional y social. Cules son algunas de las preguntas que puedo hacerle a mi mdico?  Es necesario que me rena con IT trainer en diabetes?  Es necesario  que me rena con un nutricionista?  A qu nmero puedo llamar si tengo preguntas?  Cules son los mejores momentos para controlar la glucemia? Dnde encontrar ms informacin:  Asociacin Americana de la Diabetes (American Diabetes Association): diabetes.org/food-and-fitness/food  Academia de Nutricin y Pension scheme manager (Academy of Nutrition and Dietetics): https://www.vargas.com/  The [employer] de la Diabetes y las Enfermedades Digestivas y Special educational needs teacher [facility] of Diabetes and Digestive and Kidney Diseases) (Institutos Nacionales de [place], NIH): FindJewelers.cz Resumen  Un plan de alimentacin saludable lo ayudar a Scientist, physiological glucemia y Pharmacologist un estilo de vida saludable.  Trabajar con un especialista en dietas y nutricin (nutricionista) puede ayudarlo a Designer, television/film set de alimentacin para usted.  Tenga en cuenta que los carbohidratos y el alcohol tienen efectos inmediatos en sus niveles de glucemia. Es importante contar los carbohidratos y consumir alcohol con prudencia. Esta informacin no tiene Theme park manager el consejo del mdico. Asegrese de hacerle al mdico cualquier pregunta que tenga. Document Released: 01/31/2008 Document Revised: 02/13/2017 Document Reviewed: 02/13/2017 Elsevier Interactive Patient Education  2018 Elsevier Inc.      Edwina Barth, MD [facility]

## 2017-12-02 LAB — CBC WITH DIFFERENTIAL/PLATELET
BASOS: 1 %
Basophils Absolute: 0.1 10*3/uL (ref 0.0–0.2)
EOS (ABSOLUTE): 0.3 10*3/uL (ref 0.0–0.4)
Eos: 4 %
Hematocrit: 37.1 % — ABNORMAL LOW (ref 37.5–51.0)
Hemoglobin: 12.8 g/dL — ABNORMAL LOW (ref 13.0–17.7)
IMMATURE GRANULOCYTES: 0 %
Immature Grans (Abs): 0 10*3/uL (ref 0.0–0.1)
LYMPHS ABS: 2.1 10*3/uL (ref 0.7–3.1)
Lymphs: 33 %
MCH: 30 pg (ref 26.6–33.0)
MCHC: 34.5 g/dL (ref 31.5–35.7)
MCV: 87 fL (ref 79–97)
MONOS ABS: 0.6 10*3/uL (ref 0.1–0.9)
Monocytes: 9 %
NEUTROS PCT: 53 %
Neutrophils Absolute: 3.4 10*3/uL (ref 1.4–7.0)
PLATELETS: 303 10*3/uL (ref 150–379)
RBC: 4.26 x10E6/uL (ref 4.14–5.80)
RDW: 13.6 % (ref 12.3–15.4)
WBC: 6.4 10*3/uL (ref 3.4–10.8)

## 2017-12-02 LAB — COMPREHENSIVE METABOLIC PANEL
A/G RATIO: 1.3 (ref 1.2–2.2)
ALK PHOS: 78 IU/L (ref 39–117)
ALT: 95 IU/L — AB (ref 0–44)
AST: 53 IU/L — AB (ref 0–40)
Albumin: 4.1 g/dL (ref 3.6–4.8)
BILIRUBIN TOTAL: 0.2 mg/dL (ref 0.0–1.2)
BUN/Creatinine Ratio: 24 (ref 10–24)
BUN: 37 mg/dL — ABNORMAL HIGH (ref 8–27)
CALCIUM: 9.4 mg/dL (ref 8.6–10.2)
CHLORIDE: 103 mmol/L (ref 96–106)
CO2: 19 mmol/L — ABNORMAL LOW (ref 20–29)
Creatinine, Ser: 1.55 mg/dL — ABNORMAL HIGH (ref 0.76–1.27)
GFR calc Af Amer: 53 mL/min/{1.73_m2} — ABNORMAL LOW (ref >59)
GFR, EST NON AFRICAN AMERICAN: 46 mL/min/{1.73_m2} — AB (ref >59)
Globulin, Total: 3.2 g/dL (ref 1.5–4.5)
Glucose: 248 mg/dL — ABNORMAL HIGH (ref 65–99)
Potassium: 5.1 mmol/L (ref 3.5–5.2)
SODIUM: 139 mmol/L (ref 134–144)
Total Protein: 7.3 g/dL (ref 6.0–8.5)

## 2017-12-02 LAB — LIPID PANEL
CHOLESTEROL TOTAL: 96 mg/dL — AB (ref 100–199)
Chol/HDL Ratio: 3 ratio (ref 0.0–5.0)
HDL: 32 mg/dL — ABNORMAL LOW (ref >39)
LDL Calculated: 15 mg/dL (ref 0–99)
Triglycerides: 245 mg/dL — ABNORMAL HIGH (ref 0–149)
VLDL Cholesterol Cal: 49 mg/dL — ABNORMAL HIGH (ref 5–40)

## 2017-12-02 LAB — HEMOGLOBIN A1C
ESTIMATED AVERAGE GLUCOSE: 174 mg/dL
HEMOGLOBIN A1C: 7.7 % — AB (ref 4.8–5.6)

## 2017-12-04 ENCOUNTER — Other Ambulatory Visit: Payer: Self-pay | Admitting: Emergency Medicine

## 2017-12-04 DIAGNOSIS — I255 Ischemic cardiomyopathy: Secondary | ICD-10-CM | POA: Diagnosis not present

## 2017-12-04 DIAGNOSIS — I2510 Atherosclerotic heart disease of native coronary artery without angina pectoris: Secondary | ICD-10-CM | POA: Diagnosis not present

## 2017-12-04 DIAGNOSIS — E782 Mixed hyperlipidemia: Secondary | ICD-10-CM | POA: Diagnosis not present

## 2017-12-04 DIAGNOSIS — N183 Chronic kidney disease, stage 3 unspecified: Secondary | ICD-10-CM

## 2017-12-04 DIAGNOSIS — I447 Left bundle-branch block, unspecified: Secondary | ICD-10-CM | POA: Diagnosis not present

## 2017-12-05 ENCOUNTER — Encounter: Payer: Self-pay | Admitting: *Deleted

## 2017-12-06 ENCOUNTER — Telehealth: Payer: Self-pay | Admitting: Emergency Medicine

## 2017-12-06 MED ORDER — RAMIPRIL 5 MG PO CAPS: 5.0000 mg | ORAL_CAPSULE | Freq: Every day | ORAL | 0 refills | Status: DC

## 2017-12-06 MED ORDER — DORZOLAMIDE HCL 2 % OP SOLN: 1.0000 [drp] | Freq: Two times a day (BID) | OPHTHALMIC | 0 refills | Status: AC

## 2017-12-06 MED ORDER — PREDNISOLONE ACETATE 1 % OP SUSP: 1.0000 [drp] | Freq: Three times a day (TID) | OPHTHALMIC | 0 refills | Status: AC

## 2017-12-06 NOTE — Telephone Encounter (Signed)
Copied from CRM [id number]. Topic: Inquiry >> Dec 06, 2017 12:44 PM Yvonna Alanis wrote: Reason for CRM: Patient needs refills of Ramipril (ALTACE) 5 MG capsule, prednisoLONE acetate (PRED FORTE) 1 % ophthalmic suspension, and dorzolamide (TRUSOPT) 2 % ophthalmic solution. Patient is out of all three medications. Patient's preferred pharmacy is [facility] [address] ([address] [place] - [address] [phone] (Phone) [phone] (Fax)

## 2017-12-08 ENCOUNTER — Encounter: Payer: Self-pay | Admitting: Endocrinology

## 2017-12-08 ENCOUNTER — Ambulatory Visit (INDEPENDENT_AMBULATORY_CARE_PROVIDER_SITE_OTHER): Payer: Medicare Other | Admitting: Endocrinology

## 2017-12-08 VITALS — BP 100/60 | HR 71 | Ht 62.0 in | Wt 175.4 lb

## 2017-12-08 DIAGNOSIS — E1140 Type 2 diabetes mellitus with diabetic neuropathy, unspecified: Secondary | ICD-10-CM | POA: Diagnosis not present

## 2017-12-08 DIAGNOSIS — I255 Ischemic cardiomyopathy: Secondary | ICD-10-CM

## 2017-12-08 DIAGNOSIS — Z794 Long term (current) use of insulin: Secondary | ICD-10-CM

## 2017-12-08 MED ORDER — REPAGLINIDE 1 MG PO TABS: 1.0000 mg | ORAL_TABLET | Freq: Three times a day (TID) | ORAL | 11 refills | Status: DC

## 2017-12-08 MED ORDER — INSULIN DETEMIR 100 UNIT/ML FLEXPEN: 10.0000 [IU] | PEN_INJECTOR | SUBCUTANEOUS | 11 refills | Status: DC

## 2017-12-08 NOTE — Progress Notes (Signed)
Subjective:    Patient ID: Peter Cole, male    DOB: August 31, 1951, 67 y.o.   MRN: [id number]  HPI Pt returns for f/u of diabetes mellitus: DM type: Insulin-requiring type 2 Dx'ed: 2015 Complications: CVA, polyneuropathy, renal failure, and toe amputation.  Therapy: insulin since 2017  DKA: never Severe hypoglycemia: never.  Pancreatitis: never. Pancreatic imaging: never. Other: he is on qd insulin, at least for now.   Interval history: no cbg record, but states cbg's vary from 80-200.  There is no trend throughout the day.  He wants to transition to oral rx. pt states he feels well in general. Past Medical History:  Diagnosis Date  . CAD (coronary artery disease)   . Diabetes mellitus without complication (HCC)   . Fungal infection of eye 05/12/2017  . Hyperlipidemia   . Ischemic cardiomyopathy 07/06/2017   06/16/2017. [facility].  Yates Decamp, MD.  Coronary artery bypass graft.  CKD stage 3 due to type 2 diabetes mellitus.  Hyperlipidemia, mixed.  . Ischemic cardiomyopathy   . LBBB (left bundle branch block) 05/12/2017  . Myocardial infarction (HCC)   . 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 Warfarin anticoagulation 05/12/2017    Past Surgical History:  Procedure Laterality Date  . CORONARY ARTERY BYPASS GRAFT    . EYE SURGERY    . LEFT TOE AMPUTATION Left   . TEE WITHOUT CARDIOVERSION N/A 08/25/2017   Procedure: TRANSESOPHAGEAL ECHOCARDIOGRAM (TEE);  Surgeon: Yates Decamp, MD;  Location: [facility] ENDOSCOPY;  Service: Cardiovascular;  Laterality: N/A;    Social History   Socioeconomic History  . Marital status: Single    Spouse name: Not on file  . Number of children: Not on file  . Years of education: Not on file  . Highest education level: Not on file  Social Needs  . Financial resource strain: Not on file  . Food insecurity - worry: Not on file  . Food insecurity - inability: Not on file  . Transportation needs -  medical: Not on file  . Transportation needs - non-medical: Not on file  Occupational History  . Not on file  Tobacco Use  . Smoking status: Former Games developer  . Smokeless tobacco: Never Used  . Tobacco comment: 40 years ago  Substance and Sexual Activity  . Alcohol use: No  . Drug use: No  . Sexual activity: Not on file  Other Topics Concern  . Not on file  Social History Narrative  . Not on file    Current Outpatient Medications on File Prior to Visit  Medication Sig Dispense Refill  . aspirin EC 81 MG tablet Take 81 mg by mouth daily.    Marland Kitchen atorvastatin (LIPITOR) 40 MG tablet Take 1 tablet (40 mg total) by mouth daily at 6 PM. 30 tablet 10  . carvedilol (COREG) 25 MG tablet Take 1 tablet (25 mg total) by mouth 2 (two) times daily. 180 tablet 3  . clopidogrel (PLAVIX) 75 MG tablet Take 1 tablet (75 mg total) by mouth daily. 901 tablet 1  . dorzolamide (TRUSOPT) 2 % ophthalmic solution Place 1 drop into the left eye 2 (two) times daily. 10 mL 0  . pantoprazole (PROTONIX) 40 MG tablet Take 40 mg by mouth daily.    . prednisoLONE acetate (PRED FORTE) 1 % ophthalmic suspension Place 1 drop into the left eye 3 (three) times daily. 5 mL 0  . ramipril (ALTACE) 5 MG capsule Take 1 capsule (5  mg total) by mouth daily. 30 capsule 0  . spironolactone (ALDACTONE) 25 MG tablet Take 1 tablet (25 mg total) by mouth daily. 90 tablet 1  . TRUEPLUS PEN NEEDLES 31G X 8 MM MISC USE  ONCE DAILY 100 each 1   No current facility-administered medications on file prior to visit.     No Known Allergies  Family History  Problem Relation Age of Onset  . Heart failure Mother   . Diabetes Mother   . Heart attack Father     BP 100/60 (BP Location: Right Arm, Patient Position: Sitting, Cuff Size: Large)   Pulse 71   Ht 5\' 2"  (1.575 m)   Wt 175 lb 6.4 oz (79.6 kg)   SpO2 99%   BMI 32.08 kg/m    Review of Systems He denies hypoglycemia    Objective:   Physical Exam VITAL SIGNS:  See vs  page GENERAL: no distress Pulses: dorsalis pedis intact bilat.   MSK: no deformity of the feet, except the left great toe is absent.   CV: trace bilat leg edema Skin:  no ulcer on the feet.  normal color and temp on the feet. Neuro: sensation is intact to touch on the feet, but decreased from normal.    Lab Results  Component Value Date   HGBA1C 7.7 (H) 12/01/2017   Lab Results  Component Value Date   CREATININE 1.55 (H) 12/01/2017   BUN 37 (H) 12/01/2017   NA 139 12/01/2017   K 5.1 12/01/2017   CL 103 12/01/2017   CO2 19 (L) 12/01/2017      Assessment & Plan:  Type 2 DM: we discussed.  goal is discontinuation of insulin Renal failure: this limits rx options H/o CVA: in this context, he should avoid hypoglycemia  Patient Instructions  check your blood sugar twice a day.  vary the time of day when you check, between before the 3 meals, and at bedtime.  also check if you have symptoms of your blood sugar being too high or too low.  please keep a record of the readings and bring it to your next appointment here (or you can bring the meter itself).  You can write it on any piece of paper.  please call us sooner if your blood sugar goes below 70, or if you have a lot of readings over 200. I have sent a prescription to your pharmacy, to add "repaglinide," and; Please reduce the levemir to 10 units each morning, and: Please come back for a follow-up appointment in 2 weeks.

## 2017-12-08 NOTE — Patient Instructions (Addendum)
check your blood sugar twice a day.  vary the time of day when you check, between before the 3 meals, and at bedtime.  also check if you have symptoms of your blood sugar being too high or too low.  please keep a record of the readings and bring it to your next appointment here (or you can bring the meter itself).  You can write it on any piece of paper.  please call us sooner if your blood sugar goes below 70, or if you have a lot of readings over 200. I have sent a prescription to your pharmacy, to add "repaglinide," and; Please reduce the levemir to 10 units each morning, and: Please come back for a follow-up appointment in 2 weeks.

## 2017-12-20 ENCOUNTER — Telehealth: Payer: Self-pay | Admitting: Emergency Medicine

## 2017-12-20 NOTE — Telephone Encounter (Signed)
Copied from CRM [id number]. Topic: Quick Communication - See Telephone Encounter >> Dec 20, 2017 11:52 AM Louie Bun, Rosey Bath D wrote: CRM for notification. See Telephone encounter for: 12/20/17. Patient son Peter Cole called and said that [facility] will not give patient is medication dorzolamide (TRUSOPT) 2 % ophthalmic solution. He would like to talk to someone about this. They said its too soon for patient to get it but they said he is out of it.

## 2017-12-21 NOTE — Telephone Encounter (Signed)
Spoke to pharmacist - Pt cannot get Tursopt eye drops through insurance until Mar 1. - one bottle should last pt 75 days.  Spoke to son - he says bottle is empty - thinks pt is putting a drop in each eye. Reinforced Sig: 1 (one) drip in LEFT eye 2 (two) times daily.    Advised he can get the rx through Good Rx coupon for $24.06.  Check with [facility] to see if they will refill and pt pay cash for drops.

## 2017-12-22 NOTE — Telephone Encounter (Signed)
Thanks

## 2017-12-22 NOTE — Telephone Encounter (Signed)
Thanks. Also it might be helpful to find out who started him on this anti-glaucoma medication. He must be under the care of an Ophthalmologist who may be able to help in this situation. He also needs to follow up with him/her.

## 2017-12-22 NOTE — Telephone Encounter (Signed)
Spoke to son,  Pt has appt with [facility] 3/11 Encouraged him to make sure his father keeps appt.

## 2018-01-02 ENCOUNTER — Ambulatory Visit: Payer: Medicare Other | Admitting: Endocrinology

## 2018-01-09 DIAGNOSIS — I255 Ischemic cardiomyopathy: Secondary | ICD-10-CM | POA: Diagnosis not present

## 2018-01-11 ENCOUNTER — Encounter: Payer: Self-pay | Admitting: Emergency Medicine

## 2018-01-11 ENCOUNTER — Telehealth: Payer: Self-pay | Admitting: Emergency Medicine

## 2018-01-11 MED ORDER — RAMIPRIL 5 MG PO CAPS: 5.0000 mg | ORAL_CAPSULE | Freq: Every day | ORAL | 3 refills | Status: DC

## 2018-01-11 NOTE — Telephone Encounter (Signed)
Copied from CRM [id number]. Topic: General - Other >> Jan 11, 2018 10:22 AM Cecelia Byars, RMA wrote: Reason for CRM: Medication refill request for ramipril (ALTACE) 5 MG capsule to be sent to [facility] [address]

## 2018-01-11 NOTE — Telephone Encounter (Signed)
Last office visit 12/01/17; to return in 6 mos. (approx. 05/31/18) PCP:  Dr. Alvy Bimler [facility]acy: [facility] [address]   Refilled Ramipril per protocol.

## 2018-01-15 DIAGNOSIS — H01022 Squamous blepharitis right lower eyelid: Secondary | ICD-10-CM | POA: Diagnosis not present

## 2018-01-15 DIAGNOSIS — H01021 Squamous blepharitis right upper eyelid: Secondary | ICD-10-CM | POA: Diagnosis not present

## 2018-01-15 DIAGNOSIS — H25812 Combined forms of age-related cataract, left eye: Secondary | ICD-10-CM | POA: Diagnosis not present

## 2018-01-15 DIAGNOSIS — H2511 Age-related nuclear cataract, right eye: Secondary | ICD-10-CM | POA: Diagnosis not present

## 2018-01-15 DIAGNOSIS — Z947 Corneal transplant status: Secondary | ICD-10-CM | POA: Diagnosis not present

## 2018-01-15 DIAGNOSIS — H40013 Open angle with borderline findings, low risk, bilateral: Secondary | ICD-10-CM | POA: Diagnosis not present

## 2018-01-15 DIAGNOSIS — H01025 Squamous blepharitis left lower eyelid: Secondary | ICD-10-CM | POA: Diagnosis not present

## 2018-01-15 DIAGNOSIS — H01024 Squamous blepharitis left upper eyelid: Secondary | ICD-10-CM | POA: Diagnosis not present

## 2018-01-15 DIAGNOSIS — T86890 Other transplanted tissue rejection: Secondary | ICD-10-CM | POA: Diagnosis not present

## 2018-01-17 ENCOUNTER — Telehealth: Payer: Self-pay | Admitting: *Deleted

## 2018-01-17 NOTE — Telephone Encounter (Signed)
Faxed Rx to [facility] pharmacy for 90 day supply of Ramipril, per Dr Alvy Bimler. Confirmation page received at 6:07 pm.

## 2018-02-01 DIAGNOSIS — I255 Ischemic cardiomyopathy: Secondary | ICD-10-CM | POA: Diagnosis not present

## 2018-02-01 DIAGNOSIS — E1122 Type 2 diabetes mellitus with diabetic chronic kidney disease: Secondary | ICD-10-CM | POA: Diagnosis not present

## 2018-02-01 DIAGNOSIS — I129 Hypertensive chronic kidney disease with stage 1 through stage 4 chronic kidney disease, or unspecified chronic kidney disease: Secondary | ICD-10-CM | POA: Diagnosis not present

## 2018-02-01 DIAGNOSIS — I639 Cerebral infarction, unspecified: Secondary | ICD-10-CM | POA: Diagnosis not present

## 2018-02-01 DIAGNOSIS — D631 Anemia in chronic kidney disease: Secondary | ICD-10-CM | POA: Diagnosis not present

## 2018-02-01 DIAGNOSIS — N183 Chronic kidney disease, stage 3 (moderate): Secondary | ICD-10-CM | POA: Diagnosis not present

## 2018-02-02 ENCOUNTER — Ambulatory Visit: Payer: Medicare Other | Admitting: Cardiology

## 2018-02-06 ENCOUNTER — Encounter: Payer: Self-pay | Admitting: Cardiology

## 2018-02-06 ENCOUNTER — Ambulatory Visit (INDEPENDENT_AMBULATORY_CARE_PROVIDER_SITE_OTHER): Payer: Medicare Other | Admitting: Cardiology

## 2018-02-06 VITALS — BP 130/70 | HR 67 | Ht 65.0 in | Wt 177.6 lb

## 2018-02-06 DIAGNOSIS — I25810 Atherosclerosis of coronary artery bypass graft(s) without angina pectoris: Secondary | ICD-10-CM

## 2018-02-06 DIAGNOSIS — I10 Essential (primary) hypertension: Secondary | ICD-10-CM | POA: Diagnosis not present

## 2018-02-06 DIAGNOSIS — I255 Ischemic cardiomyopathy: Secondary | ICD-10-CM | POA: Diagnosis not present

## 2018-02-06 DIAGNOSIS — E785 Hyperlipidemia, unspecified: Secondary | ICD-10-CM | POA: Diagnosis not present

## 2018-02-06 NOTE — Patient Instructions (Signed)
Medication Instructions: Your physician recommends that you continue on your current medications as directed. Please refer to the Current Medication list given to you today.   Labwork: None ordered  Procedures/Testing: None ordered  Follow-Up: Follow up in September with Dr. Elberta Fortis.  Any Additional Special Instructions Will Be Listed Below (If Applicable).     If you need a refill on your cardiac medications before your next appointment, please call your pharmacy.

## 2018-02-06 NOTE — Progress Notes (Signed)
Electrophysiology Office Note   Date:  02/06/2018   ID:  Peter Cole, DOB 31-May-1951, MRN [id number]  PCP:  Patient, No Pcp Per  Cardiologist:  Jacinto Halim Primary Electrophysiologist:  Analise Glotfelty [name], MD    Chief Complaint  Patient presents with  . Follow-up    Ischemic cardiomyopathy/CAD     History of Present Illness: Peter Cole is a 67 y.o. male who is being seen today for the evaluation of CHF at the request of Saydie Gerdts, Andree Coss, MD. Presenting today for electrophysiology evaluation. He has a history of hypertension, hyperlipidemia, stage III CKG, ischemic cardiomyopathy with an EF of 15-20%, coronary artery disease status post CABG, and type 2 diabetes on insulin. He had an MI June 2018 in [place]. He was found to have a mural thrombus at that time and was placed on Coumadin. 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  Today, denies symptoms of palpitations, chest pain, shortness of breath, orthopnea, PND, lower extremity edema, claudication, dizziness, presyncope, syncope, bleeding, or neurologic sequela. The patient is tolerating medications without difficulties.  Overall he continues to feel well.  He is not have any chest pain or shortness of breath.  He does not have weakness or fatigue.  He is planning to travel to [place] this summer for 3 months.    Past Medical History:  Diagnosis Date  . CAD (coronary artery disease)   . Diabetes mellitus without complication (HCC)   . Fungal infection of eye 05/12/2017  . Hyperlipidemia   . Ischemic cardiomyopathy 07/06/2017   06/16/2017. [facility].  Yates Decamp, MD.  Coronary artery bypass graft.  CKD stage 3 due to type 2 diabetes mellitus.  Hyperlipidemia, mixed.  . Ischemic cardiomyopathy   . LBBB (left bundle branch block) 05/12/2017  . Myocardial infarction (HCC)   . 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  Warfarin anticoagulation 05/12/2017   Past Surgical History:  Procedure Laterality Date  . CORONARY ARTERY BYPASS GRAFT    . EYE SURGERY    . LEFT TOE AMPUTATION Left   . TEE WITHOUT CARDIOVERSION N/A 08/25/2017   Procedure: TRANSESOPHAGEAL ECHOCARDIOGRAM (TEE);  Surgeon: Yates Decamp, MD;  Location: [facility] ENDOSCOPY;  Service: Cardiovascular;  Laterality: N/A;     Current Outpatient Medications  Medication Sig Dispense Refill  . aspirin EC 81 MG tablet Take 81 mg by mouth daily.    Marland Kitchen atorvastatin (LIPITOR) 40 MG tablet Take 1 tablet (40 mg total) by mouth daily at 6 PM. 30 tablet 10  . carvedilol (COREG) 25 MG tablet Take 1 tablet (25 mg total) by mouth 2 (two) times daily. 180 tablet 3  . clopidogrel (PLAVIX) 75 MG tablet Take 1 tablet (75 mg total) by mouth daily. 901 tablet 1  . dorzolamide (TRUSOPT) 2 % ophthalmic solution Place 1 drop into the left eye 2 (two) times daily. 10 mL 0  . Insulin Detemir (LEVEMIR FLEXPEN) 100 UNIT/ML Pen Inject 10 Units into the skin every morning. And pen needles 1/day 15 mL 11  . prednisoLONE acetate (PRED FORTE) 1 % ophthalmic suspension Place 1 drop into the left eye 3 (three) times daily. 5 mL 0  . ramipril (ALTACE) 5 MG capsule Take 1 capsule (5 mg total) by mouth daily. 30 capsule 3  . spironolactone (ALDACTONE) 25 MG tablet Take 1 tablet (25 mg total) by mouth daily. 90 tablet 1   No current  facility-administered medications for this visit.     Allergies:   Patient has no known allergies.   Social History:  The patient  reports that he has quit smoking. He has never used smokeless tobacco. He reports that he does not drink alcohol or use drugs.   Family History:  The patient's family history includes Diabetes in his mother; Heart attack in his father; Heart failure in his mother.   ROS:  Please see the history of present illness.   Otherwise, review of systems is positive for pain, visual changes, cough.   All other systems are reviewed and  negative.   PHYSICAL EXAM: VS:  BP 130/70   Pulse 67   Ht 5\' 5"  (1.651 m)   Wt 177 lb 9.6 oz (80.6 kg)   SpO2 98%   BMI 29.55 kg/m  , BMI Body mass index is 29.55 kg/m. GEN: Well nourished, well developed, in no acute distress  HEENT: normal  Neck: no JVD, carotid bruits, or masses Cardiac: RRR; no murmurs, rubs, or gallops,no edema  Respiratory:  clear to auscultation bilaterally, normal work of breathing GI: soft, nontender, nondistended, + BS MS: no deformity or atrophy  Skin: warm and dry Neuro:  Strength and sensation are intact Psych: euthymic mood, full affect  EKG:  EKG is not ordered today. Personal review of the ekg ordered 08/22/17 shows SR, LBBB   Recent Labs: 12/01/2017: ALT 95; BUN 37; Creatinine, Ser 1.55; Hemoglobin 12.8; Platelets 303; Potassium 5.1; Sodium 139    Lipid Panel     Component Value Date/Time   CHOL 96 (L) 12/01/2017 1146   TRIG 245 (H) 12/01/2017 1146   HDL 32 (L) 12/01/2017 1146   CHOLHDL 3.0 12/01/2017 1146   CHOLHDL 4.5 08/23/2017 0400   VLDL 25 08/23/2017 0400   LDLCALC 15 12/01/2017 1146     Wt Readings from Last 3 Encounters:  02/06/18 177 lb 9.6 oz (80.6 kg)  12/08/17 175 lb 6.4 oz (79.6 kg)  12/01/17 177 lb (80.3 kg)      Other studies Reviewed: Additional studies/ records that were reviewed today include: TTE 01/10/18 Review of the above records today demonstrates:  Ejection fraction 20-25%, grade 2 diastolic dysfunction Moderately dilated left atrium Mild mitral regurgitation   ASSESSMENT AND PLAN:  1.  Ischemic cardiomyopathy: Currently on optimal medical therapy and his ejection fraction remains reduced.  He also qualify for an ICD.  He is planning to travel home to [place] within the next month and he Lauramae Kneisley be gone for 3 months.  I would prefer him not travel with a recent ICD in place for that amount of time.  We Louvenia Golomb thus plan to see him back once he returns and implant his ICD at that time.  I feel that this is  reasonable as he is asymptomatic and has not had any ventricular arrhythmias.  2. Coronary artery disease status post CABG: Current chest pain.  Continue current management.  3. Hypertension: Well-controlled today  4. Hyperlipidemia: Continue atorvastatin  Current medicines are reviewed at length with the patient today.   The patient does not have concerns regarding his medicines.  The following changes were made today: None  Labs/ tests ordered today include:  No orders of the defined types were placed in this encounter.  Case discussed with primary cardiology  Disposition:   FU with Dmarius Reeder 5 months  Signed, Kennedey Digilio [name], MD  02/06/2018 4:43 PM     [facility] [address]  [address] [place] [address] [phone] (office) [phone] (fax)

## 2018-02-14 ENCOUNTER — Encounter: Payer: Self-pay | Admitting: Endocrinology

## 2018-02-14 ENCOUNTER — Ambulatory Visit (INDEPENDENT_AMBULATORY_CARE_PROVIDER_SITE_OTHER): Payer: Medicare Other | Admitting: Endocrinology

## 2018-02-14 VITALS — BP 116/60 | HR 57 | Wt 176.6 lb

## 2018-02-14 DIAGNOSIS — E1140 Type 2 diabetes mellitus with diabetic neuropathy, unspecified: Secondary | ICD-10-CM

## 2018-02-14 DIAGNOSIS — Z794 Long term (current) use of insulin: Secondary | ICD-10-CM

## 2018-02-14 DIAGNOSIS — I255 Ischemic cardiomyopathy: Secondary | ICD-10-CM | POA: Diagnosis not present

## 2018-02-14 LAB — POCT GLYCOSYLATED HEMOGLOBIN (HGB A1C): HEMOGLOBIN A1C: 6.5

## 2018-02-14 MED ORDER — REPAGLINIDE 2 MG PO TABS: 2.0000 mg | ORAL_TABLET | Freq: Three times a day (TID) | ORAL | 1 refills | Status: AC

## 2018-02-14 NOTE — Progress Notes (Signed)
Subjective:    Patient ID: Peter Cole, male    DOB: 10-Apr-1951, 67 y.o.   MRN: [id number]  HPI Pt returns for f/u of diabetes mellitus: DM type: Insulin-requiring type 2 Dx'ed: 2015 Complications: CVA, polyneuropathy, renal failure, and toe amputation.  Therapy: insulin since 2017, and repaglinide.    DKA: never Severe hypoglycemia: never.  Pancreatitis: never. Pancreatic imaging: never. Other: he is on qd insulin, at least for now.   Interval history: no cbg record, but states cbg's are well-controlled. There is no trend throughout the day.  He wants to transition to oral rx. pt states he feels well in general.  Soon he will go to [place] x 5 months.   Past Medical History:  Diagnosis Date  . CAD (coronary artery disease)   . Diabetes mellitus without complication (HCC)   . Fungal infection of eye 05/12/2017  . Hyperlipidemia   . Ischemic cardiomyopathy 07/06/2017   06/16/2017. [facility].  Yates Decamp, MD.  Coronary artery bypass graft.  CKD stage 3 due to type 2 diabetes mellitus.  Hyperlipidemia, mixed.  . Ischemic cardiomyopathy   . LBBB (left bundle branch block) 05/12/2017  . Myocardial infarction (HCC)   . 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 Warfarin anticoagulation 05/12/2017    Past Surgical History:  Procedure Laterality Date  . CORONARY ARTERY BYPASS GRAFT    . EYE SURGERY    . LEFT TOE AMPUTATION Left   . TEE WITHOUT CARDIOVERSION N/A 08/25/2017   Procedure: TRANSESOPHAGEAL ECHOCARDIOGRAM (TEE);  Surgeon: Yates Decamp, MD;  Location: [facility] ENDOSCOPY;  Service: Cardiovascular;  Laterality: N/A;    Social History   Socioeconomic History  . Marital status: Single    Spouse name: Not on file  . Number of children: Not on file  . Years of education: Not on file  . Highest education level: Not on file  Occupational History  . Not on file  Social Needs  . Financial resource strain: Not on file  . Food  insecurity:    Worry: Not on file    Inability: Not on file  . Transportation needs:    Medical: Not on file    Non-medical: Not on file  Tobacco Use  . Smoking status: Former Games developer  . Smokeless tobacco: Never Used  . Tobacco comment: 40 years ago  Substance and Sexual Activity  . Alcohol use: No  . Drug use: No  . Sexual activity: Not on file  Lifestyle  . Physical activity:    Days per week: Not on file    Minutes per session: Not on file  . Stress: Not on file  Relationships  . Social connections:    Talks on phone: Not on file    Gets together: Not on file    Attends religious service: Not on file    Active member of club or organization: Not on file    Attends meetings of clubs or organizations: Not on file    Relationship status: Not on file  . Intimate partner violence:    Fear of current or ex partner: Not on file    Emotionally abused: Not on file    Physically abused: Not on file    Forced sexual activity: Not on file  Other Topics Concern  . Not on file  Social History Narrative  . Not on file    Current Outpatient Medications on File Prior to Visit  Medication Sig Dispense Refill  .  aspirin EC 81 MG tablet Take 81 mg by mouth daily.    Marland Kitchen atorvastatin (LIPITOR) 40 MG tablet Take 1 tablet (40 mg total) by mouth daily at 6 PM. 30 tablet 10  . carvedilol (COREG) 25 MG tablet Take 1 tablet (25 mg total) by mouth 2 (two) times daily. 180 tablet 3  . clopidogrel (PLAVIX) 75 MG tablet Take 1 tablet (75 mg total) by mouth daily. 901 tablet 1  . dorzolamide (TRUSOPT) 2 % ophthalmic solution Place 1 drop into the left eye 2 (two) times daily. 10 mL 0  . prednisoLONE acetate (PRED FORTE) 1 % ophthalmic suspension Place 1 drop into the left eye 3 (three) times daily. 5 mL 0  . ramipril (ALTACE) 5 MG capsule Take 1 capsule (5 mg total) by mouth daily. 30 capsule 3  . spironolactone (ALDACTONE) 25 MG tablet Take 1 tablet (25 mg total) by mouth daily. 90 tablet 1   No  current facility-administered medications on file prior to visit.     No Known Allergies  Family History  Problem Relation Age of Onset  . Heart failure Mother   . Diabetes Mother   . Heart attack Father     BP 116/60 (BP Location: Left Arm, Patient Position: Sitting, Cuff Size: Normal)   Pulse (!) 57   Wt 176 lb 9.6 oz (80.1 kg)   SpO2 98%   BMI 29.39 kg/m    Review of Systems He denies hypoglycemia.      Objective:   Physical Exam VITAL SIGNS:  See vs page GENERAL: no distress Pulses: dorsalis pedis intact bilat.   MSK: no deformity of the feet, except the left great toe is absent.   CV: trace bilat leg edema Skin:  no ulcer on the feet.  normal color and temp on the feet. Neuro: sensation is intact to touch on the feet, but decreased from normal.   A1c=6.5%    Assessment & Plan:  Type 2 DM, with CVA: he is ready to d/c insulin Renal failure: this limits rx options.  Patient Instructions  check your blood sugar twice a day.  vary the time of day when you check, between before the 3 meals, and at bedtime.  also check if you have symptoms of your blood sugar being too high or too low.  please keep a record of the readings and bring it to your next appointment here (or you can bring the meter itself).  You can write it on any piece of paper.  please call us sooner if your blood sugar goes below 70, or if you have a lot of readings over 200. I have sent a prescription to your pharmacy, to double the "repaglinide," and:  Please stop taking the levemir.   Please come back for a follow-up appointment in 6 months.

## 2018-02-14 NOTE — Patient Instructions (Addendum)
check your blood sugar twice a day.  vary the time of day when you check, between before the 3 meals, and at bedtime.  also check if you have symptoms of your blood sugar being too high or too low.  please keep a record of the readings and bring it to your next appointment here (or you can bring the meter itself).  You can write it on any piece of paper.  please call us sooner if your blood sugar goes below 70, or if you have a lot of readings over 200. I have sent a prescription to your pharmacy, to double the "repaglinide," and:  Please stop taking the levemir.   Please come back for a follow-up appointment in 6 months.

## 2018-02-16 ENCOUNTER — Other Ambulatory Visit: Payer: Self-pay | Admitting: Emergency Medicine

## 2018-02-16 NOTE — Telephone Encounter (Signed)
Patient is requesting 5 month supply of the following medications: Ramipril 5 mg, Spironolactone 25 mg,  Clopidogrel 75 mg,Atorvastatin 40 mg, and Carvedilol 25 mg  Patient is going out of the country for 5 months.  LOV: 12/01/17   PCP: Alvy Bimler  Pharmacy: verified

## 2018-02-16 NOTE — Telephone Encounter (Signed)
150 day supply of requested medications pended for provider signature.

## 2018-02-16 NOTE — Telephone Encounter (Signed)
Copied from CRM [id number]. Topic: Quick Communication - Rx Refill/Question >> Feb 16, 2018  2:11 PM Louie Bun, Rosey Bath D wrote: Medication: ramipril (ALTACE) 5 MG capsule, spironolactone (ALDACTONE) 25 MG tablet, clopidogrel (PLAVIX) 75 MG tablet, atorvastatin (LIPITOR) 40 MG tablet, carvedilol (COREG) 25 MG tablet. Has the patient contacted their pharmacy? Yes but patient needs a 5 month supply due to he will be traveling out of the country and will be gone for 5 month. (Agent: If no, request that the patient contact the pharmacy for the refill.) Preferred Pharmacy (with phone number or street name): [facility] - [place], [place] - [address] Agent: Please be advised that RX refills may take up to 3 business days. We ask that you follow-up with your pharmacy.

## 2018-02-20 ENCOUNTER — Telehealth: Payer: Self-pay | Admitting: Endocrinology

## 2018-02-20 ENCOUNTER — Other Ambulatory Visit: Payer: Self-pay

## 2018-02-20 ENCOUNTER — Telehealth: Payer: Self-pay | Admitting: Cardiology

## 2018-02-20 MED ORDER — CLOPIDOGREL BISULFATE 75 MG PO TABS: 75.0000 mg | ORAL_TABLET | Freq: Every day | ORAL | 0 refills | Status: AC

## 2018-02-20 MED ORDER — CARVEDILOL 25 MG PO TABS: 25.0000 mg | ORAL_TABLET | Freq: Two times a day (BID) | ORAL | 0 refills | Status: DC

## 2018-02-20 MED ORDER — ATORVASTATIN CALCIUM 40 MG PO TABS: 40.0000 mg | ORAL_TABLET | Freq: Every day | ORAL | 0 refills | Status: DC

## 2018-02-20 MED ORDER — SPIRONOLACTONE 25 MG PO TABS: 25.0000 mg | ORAL_TABLET | Freq: Every day | ORAL | 0 refills | Status: AC

## 2018-02-20 MED ORDER — RAMIPRIL 5 MG PO CAPS: 5.0000 mg | ORAL_CAPSULE | Freq: Every day | ORAL | 0 refills | Status: AC

## 2018-02-20 NOTE — Telephone Encounter (Signed)
New message  Clydie Braun verbalzied that she is calling for RN  She stated that pt told her that the appt that is scheduled for 07-10-2018 is  For a procedure and she wants to make sure of the reason: it just states 6 month follow up

## 2018-02-20 NOTE — Telephone Encounter (Signed)
OK 

## 2018-02-20 NOTE — Telephone Encounter (Signed)
I called and spoke with Clydie Braun patient's granddaughter. I informed her that when I called pharmacy they explained that insurance will only pay for the 90 day supply & the other two months would have to be out of pocket. The cost was extremely high at $700 for the repaglinide. Patient's granddaughter stated that they would chose the option of 90 day then pick up his prescription when they are due & have them shipped to patient.

## 2018-02-20 NOTE — Telephone Encounter (Signed)
repaglinide (PRANDIN) 2 MG tablet  Patients granddaughter stated that the patient is going out of the country and Dr Everardo All would send in a 5 month supply of this prescription to last him while he is out of town. Patients granddaughter mentioned that she thought he could pick them all up at once.    Please advise   [facility] [place], [place] - [address]

## 2018-02-21 NOTE — Telephone Encounter (Signed)
Clydie Braun asking about a procedure that pt told her he was having in September.  She states that he said that he was having a procedure on that date. Informed that no procedure is scheduled. Explained that we will discuss procedure and OV in September, once pt is back in the country. (pt is currently of of the country for the next several months) She inquired about what kind of procedure this is for.  Advised that I cannot release this information as she is not on his DPR.  Advised to have patient fill out an updated form when he returns to reflect ok to discuss his medical w/ her. She appreciates the information and thanks me for returning her call.

## 2018-02-27 ENCOUNTER — Ambulatory Visit: Payer: Medicare Other | Admitting: Emergency Medicine

## 2018-03-01 ENCOUNTER — Other Ambulatory Visit: Payer: Self-pay | Admitting: Urgent Care

## 2018-07-10 ENCOUNTER — Ambulatory Visit: Payer: Medicare Other | Admitting: Cardiology

## 2018-07-10 NOTE — Progress Notes (Deleted)
Electrophysiology Office Note   Date:  07/10/2018   ID:  Peter Cole, DOB [birth year], MRN [id number]  PCP:  Patient, No Pcp Per  Cardiologist:  Peter Cole Primary Electrophysiologist:  Peter Mace [name], Cole    No chief complaint on file.    History of Present Illness: Peter Cole is a 67 y.o. male who is being seen today for the evaluation of CHF at the request of No ref. provider found. Presenting today for electrophysiology evaluation. He has a history of hypertension, hyperlipidemia, stage III CKG, ischemic cardiomyopathy with an EF of 15-20%, coronary artery disease status post CABG, and type 2 diabetes on insulin. He had an MI June 2018 in [place]. He was found to have a mural thrombus at that time and was placed on Coumadin. 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  Today, denies symptoms of palpitations, chest pain, shortness of breath, orthopnea, PND, lower extremity edema, claudication, dizziness, presyncope, syncope, bleeding, or neurologic sequela. The patient is tolerating medications without difficulties. ***     Past Medical History:  Diagnosis Date  . CAD (coronary artery disease)   . Diabetes mellitus without complication (HCC)   . Fungal infection of eye 05/12/2017  . Hyperlipidemia   . Ischemic cardiomyopathy 07/06/2017   06/16/2017. [facility].  Peter Cole.  Coronary artery bypass graft.  CKD stage 3 due to type 2 diabetes mellitus.  Hyperlipidemia, mixed.  . Ischemic cardiomyopathy   . LBBB (left bundle branch block) 05/12/2017  . Myocardial infarction (HCC)   . 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 Warfarin anticoagulation 05/12/2017   Past Surgical History:  Procedure Laterality Date  . CORONARY ARTERY BYPASS GRAFT    . EYE SURGERY    . LEFT TOE AMPUTATION Left   . TEE WITHOUT CARDIOVERSION N/A 08/25/2017   Procedure: TRANSESOPHAGEAL  ECHOCARDIOGRAM (TEE);  Surgeon: Peter Cole;  Location: [facility] ENDOSCOPY;  Service: Cardiovascular;  Laterality: N/A;     Current Outpatient Medications  Medication Sig Dispense Refill  . aspirin EC 81 MG tablet Take 81 mg by mouth daily.    Marland Kitchen atorvastatin (LIPITOR) 40 MG tablet Take 1 tablet (40 mg total) by mouth daily at 6 PM. 150 tablet 0  . carvedilol (COREG) 25 MG tablet Take 1 tablet (25 mg total) by mouth 2 (two) times daily. 300 tablet 0  . clopidogrel (PLAVIX) 75 MG tablet Take 1 tablet (75 mg total) by mouth daily. 150 tablet 0  . dorzolamide (TRUSOPT) 2 % ophthalmic solution Place 1 drop into the left eye 2 (two) times daily. 10 mL 0  . prednisoLONE acetate (PRED FORTE) 1 % ophthalmic suspension Place 1 drop into the left eye 3 (three) times daily. 5 mL 0  . ramipril (ALTACE) 5 MG capsule Take 1 capsule (5 mg total) by mouth daily. 150 capsule 0  . repaglinide (PRANDIN) 2 MG tablet Take 1 tablet (2 mg total) by mouth 3 (three) times daily before meals. 540 tablet 1  . spironolactone (ALDACTONE) 25 MG tablet Take 1 tablet (25 mg total) by mouth daily. 150 tablet 0   No current facility-administered medications for this visit.     Allergies:   Patient has no known allergies.   Social History:  The patient  reports that he has quit smoking. He has never used smokeless tobacco. He reports that he does not drink alcohol or use drugs.  Family History:  The patient's family history includes Diabetes in his mother; Heart attack in his father; Heart failure in his mother.   ROS:  Please see the history of present illness.   Otherwise, review of systems is positive for ***.   All other systems are reviewed and negative.   PHYSICAL EXAM: VS:  There were no vitals taken for this visit. , BMI There is no height or weight on file to calculate BMI. GEN: Well nourished, well developed, in no acute distress  HEENT: normal  Neck: no JVD, carotid bruits, or masses Cardiac: ***RRR; no  murmurs, rubs, or gallops,no edema  Respiratory:  clear to auscultation bilaterally, normal work of breathing GI: soft, nontender, nondistended, + BS MS: no deformity or atrophy  Skin: warm and dry Neuro:  Strength and sensation are intact Psych: euthymic mood, full affect  EKG:  EKG {ACTION; IS/IS [id number]} ordered today. Personal review of the ekg ordered *** shows ***   Recent Labs: 12/01/2017: ALT 95; BUN 37; Creatinine, Ser 1.55; Hemoglobin 12.8; Platelets 303; Potassium 5.1; Sodium 139    Lipid Panel     Component Value Date/Time   CHOL 96 (L) 12/01/2017 1146   TRIG 245 (H) 12/01/2017 1146   HDL 32 (L) 12/01/2017 1146   CHOLHDL 3.0 12/01/2017 1146   CHOLHDL 4.5 08/23/2017 0400   VLDL 25 08/23/2017 0400   LDLCALC 15 12/01/2017 1146     Wt Readings from Last 3 Encounters:  02/14/18 176 lb 9.6 oz (80.1 kg)  02/06/18 177 lb 9.6 oz (80.6 kg)  12/08/17 175 lb 6.4 oz (79.6 kg)      Other studies Reviewed: Additional studies/ records that were reviewed today include: TTE 01/10/18 Review of the above records today demonstrates:  Ejection fraction 20-25%, grade 2 diastolic dysfunction Moderately dilated left atrium Mild mitral regurgitation   ASSESSMENT AND PLAN:  1.  Ischemic cardiomyopathy: Currently on optimal medical therapy and his ejection fraction remains reduced.  He also qualify for an ICD.  He is planning to travel home to [place] within the next month and he Peter Cole be gone for 3 months.  I would prefer him not travel with a recent ICD in place for that amount of time.  We Peter Cole thus plan to see him back once he returns and implant his ICD at that time.  I feel that this is reasonable as he is asymptomatic and has not had any ventricular arrhythmias.***  2. Coronary artery disease status post CABG: Current chest pain.  Continue current management.***  3. Hypertension: Well-controlled today***  4. Hyperlipidemia: Continue atorvastatin***  Current medicines  are reviewed at length with the patient today.   The patient does not have concerns regarding his medicines.  The following changes were made today: ***  Labs/ tests ordered today include:  No orders of the defined types were placed in this encounter.  ***  Disposition:   FU with Peter Cole *** months  Signed, Peter Mendosa [name], Cole  07/10/2018 8:58 AM     [facility] [address] [place] [address] [phone] (office) [phone] (fax)

## 2018-08-15 ENCOUNTER — Other Ambulatory Visit: Payer: Self-pay | Admitting: Physician Assistant

## 2018-08-15 DIAGNOSIS — Z7901 Long term (current) use of anticoagulants: Secondary | ICD-10-CM

## 2018-08-16 ENCOUNTER — Ambulatory Visit: Payer: Medicare Other | Admitting: Endocrinology

## 2018-08-16 DIAGNOSIS — Z0289 Encounter for other administrative examinations: Secondary | ICD-10-CM

## 2019-02-27 ENCOUNTER — Other Ambulatory Visit: Payer: Self-pay | Admitting: Cardiology

## 2019-02-27 NOTE — Telephone Encounter (Signed)
Pt's pharmacy is requesting a refill on carvedilol and atorvastatin. Would Dr. Elberta Fortis like to refill these medications? Please address

## 2019-02-27 NOTE — Telephone Encounter (Addendum)
Will forward Dr. Elberta Fortis for advisement.  (Dr. Elberta Fortis:  You saw pt yr ago to discuss ICD per [facility]. You have seen him 08/2017, 11/2017, 02/2018.  Pt no showed to Sept f/u appt. Not sure if pt has seen Ganji either. Please advise if ok to refill or if refills should go through [facility] office)

## 2019-03-05 NOTE — Telephone Encounter (Signed)
Refills should go through Dr. Verl Dicker office. Please inform pt/pharmacy

## 2019-09-29 IMAGING — CT CT HEAD W/O CM
4 series · 15 of 47 positions shown, 17 images · non-contrast
Comparison: CT brain of 01/22/2007

CLINICAL DATA: Intermittent garbled speech, tingling in the legs,
diabetes with neuropathy

EXAM:
CT HEAD WITHOUT CONTRAST
TECHNIQUE: Contiguous axial images were obtained from the base of the skull
through the vertex without intravenous contrast.

[Series 3: head without · axial · non-contrast · 0.45mm/px · z∈[-626,-506]mm · 7 of 33 slices shown, 9 images]
[im 5/33  brain]
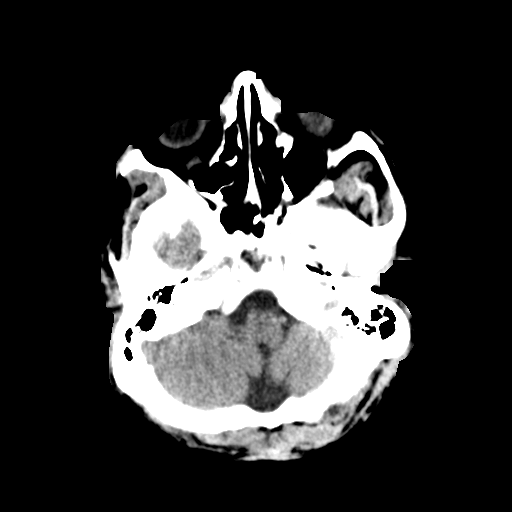
[im 5/33  bone]
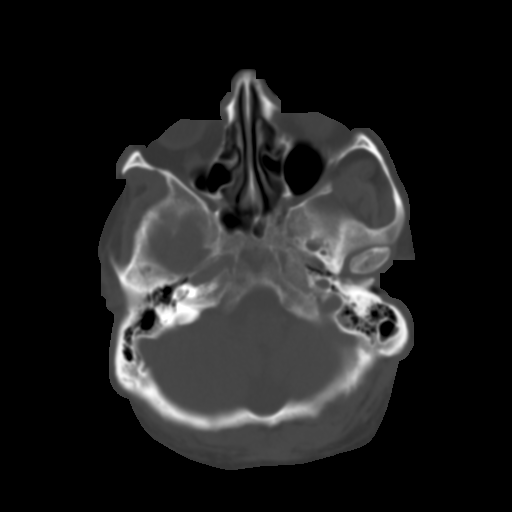
[im 9/33  brain]
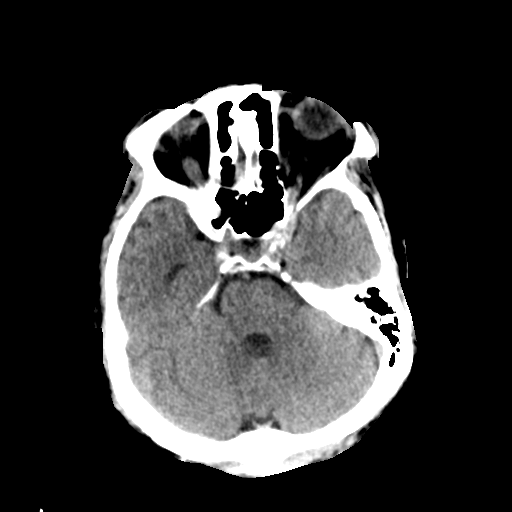
[im 13/33  brain]
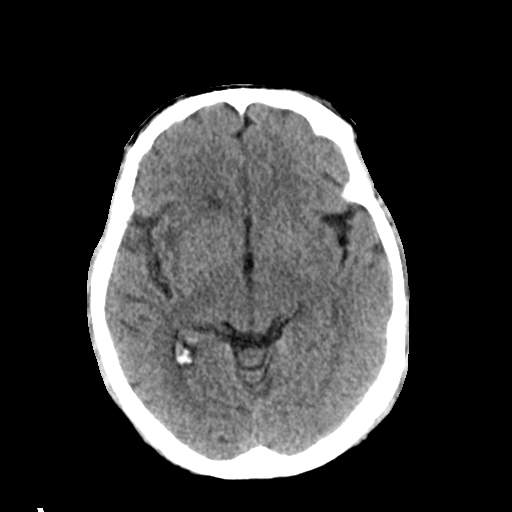
[im 17/33  brain]
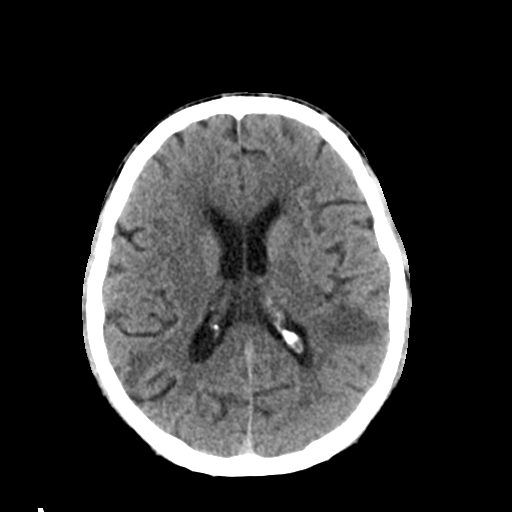
[im 21/33  brain]
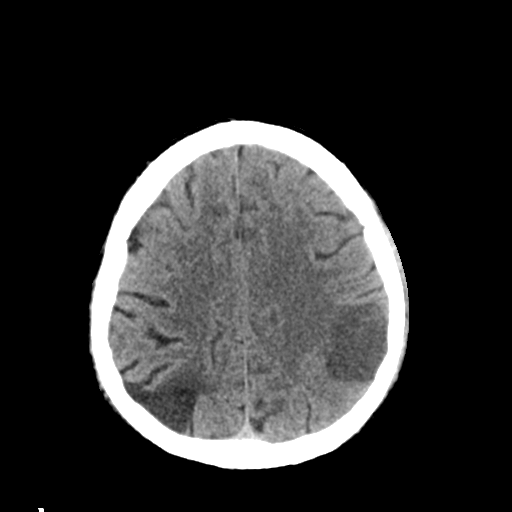
[im 21/33  bone]
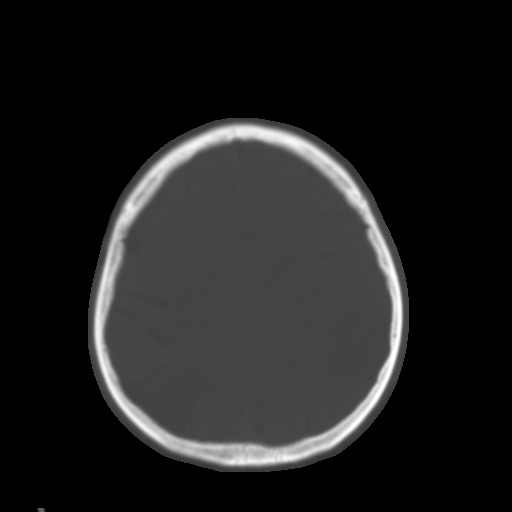
[im 25/33  brain]
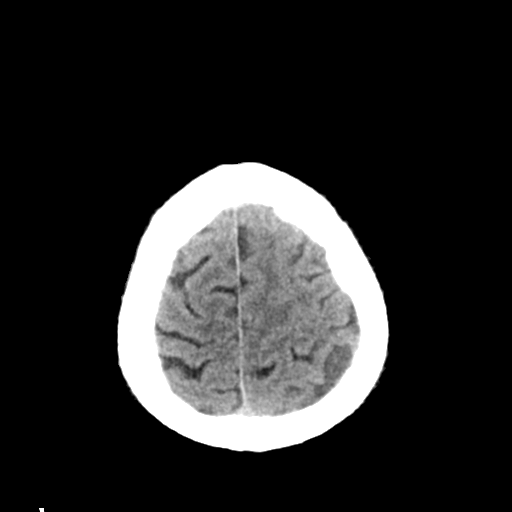
[im 29/33  brain]
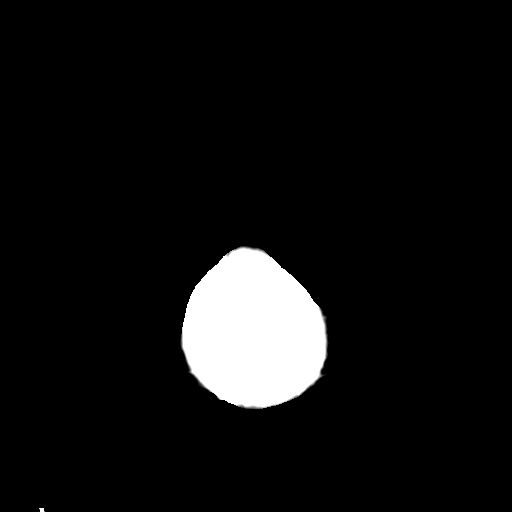

[Series 4: head bone · axial · 0.45mm/px · z∈[-630,-614]mm · 2 of 83 slices shown]
[im 9/83  bone]
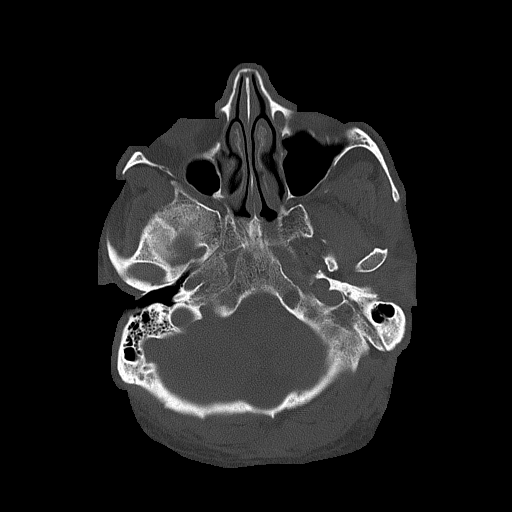
[im 17/83  bone]
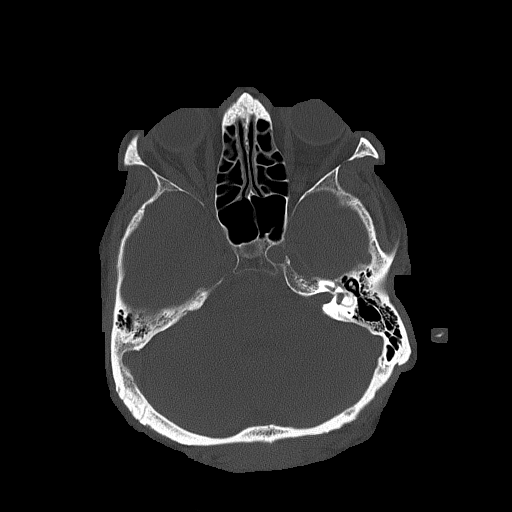

[Series 5: head without cor · coronal · non-contrast · 0.36mm/px · 3 of 67 slices shown]
[im 23/67  brain]
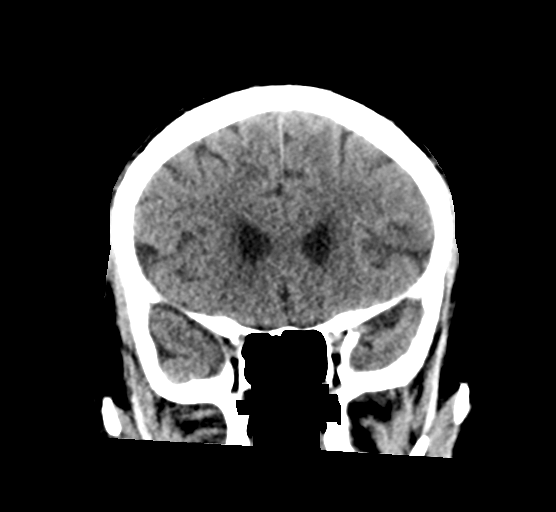
[im 30/67  brain]
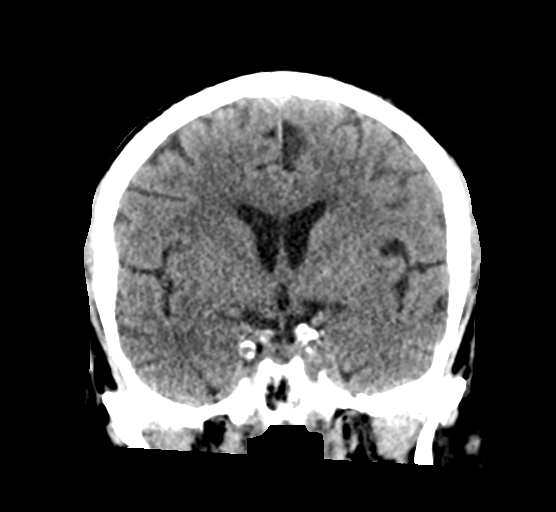
[im 37/67  brain]
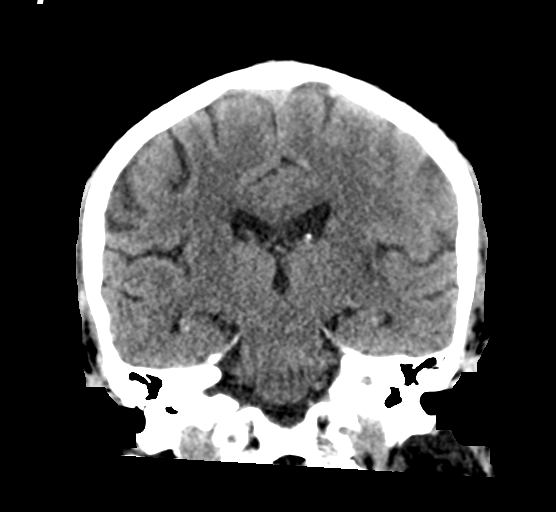

[Series 6: head without sag · sagittal · non-contrast · 0.32mm/px · 3 of 67 slices shown]
[im 23/67  brain]
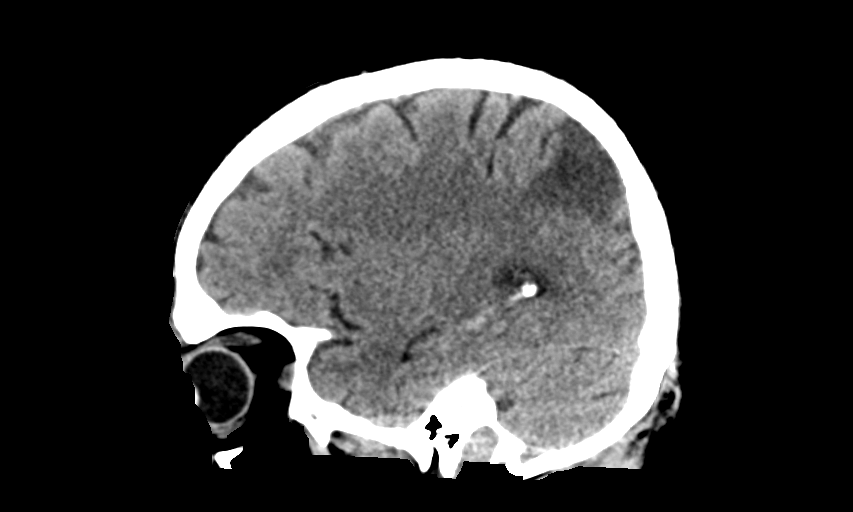
[im 34/67  brain]
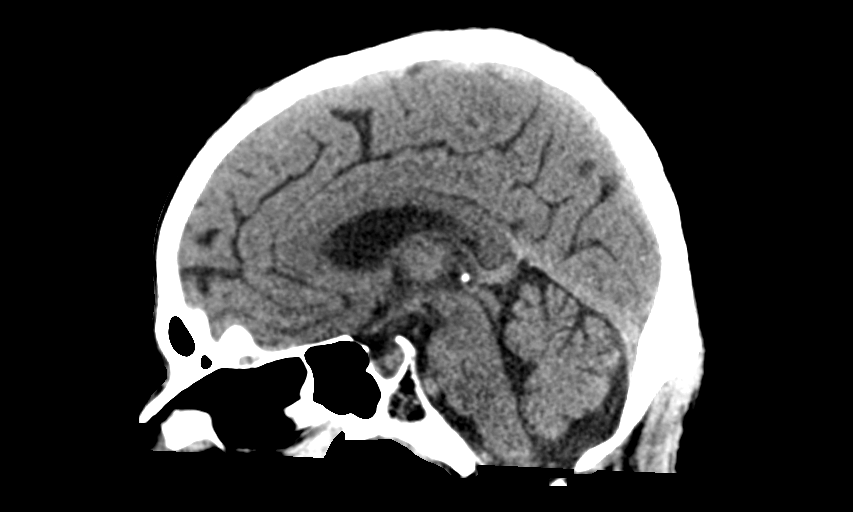
[im 45/67  brain]
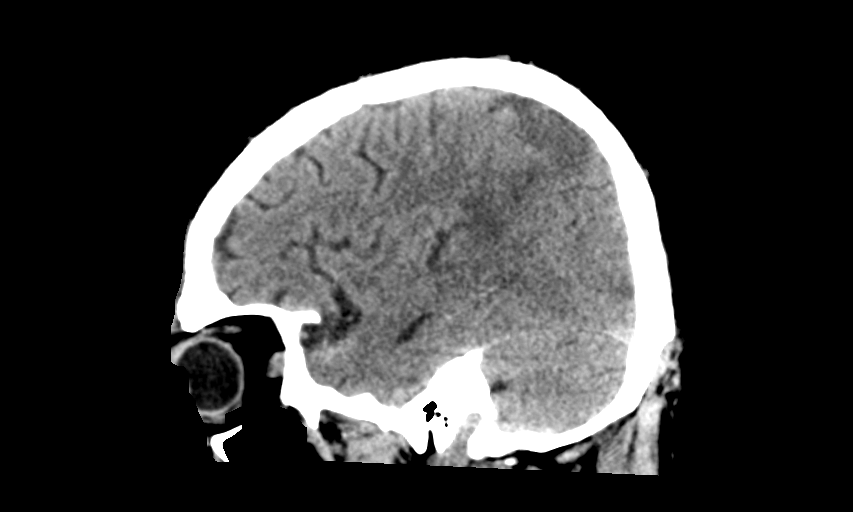

[15 of 47 positions shown; findings below may reference images not displayed]

FINDINGS: Brain: There are new relatively well defined peripheral
low-attenuation areas in the posterior left parietal infarct
posterior right parietal lobes. No hemorrhage is seen in these areas
are most consistent with edema secondary to nonhemorrhagic infarct.
No mass effect is noted. The ventricular system is normal in size
and configuration and the septum is midline in position. The fourth
ventricle and basilar cisterns are unremarkable.

Vascular: No vascular abnormality is seen on this unenhanced study.

Skull: On bone window images, no calvarial abnormality is noted.

Sinuses/Orbits: The paranasal sinuses appear well pneumatized.

Other: None.
IMPRESSION: New peripheral low-attenuation areas within the posterior left
parietal and posterior right parietal lobes consistent with
nonhemorrhagic infarcts, acute or subacute. MRI may be helpful for
further assessment.

## 2021-07-08 DEATH — deceased
